# Patient Record
Sex: Female | Born: 1945
Health system: Southern US, Community
[De-identification: ages and names within clinical notes are randomized; demographics above are authoritative.]

## PROBLEM LIST (undated history)

## (undated) DIAGNOSIS — C50919 Malignant neoplasm of unspecified site of unspecified female breast: Secondary | ICD-10-CM

## (undated) DIAGNOSIS — H409 Unspecified glaucoma: Secondary | ICD-10-CM

## (undated) DIAGNOSIS — T7840XA Allergy, unspecified, initial encounter: Secondary | ICD-10-CM

## (undated) DIAGNOSIS — I10 Essential (primary) hypertension: Secondary | ICD-10-CM

## (undated) DIAGNOSIS — J40 Bronchitis, not specified as acute or chronic: Secondary | ICD-10-CM

## (undated) DIAGNOSIS — M1990 Unspecified osteoarthritis, unspecified site: Secondary | ICD-10-CM

## (undated) DIAGNOSIS — C4490 Unspecified malignant neoplasm of skin, unspecified: Secondary | ICD-10-CM

## (undated) HISTORY — PX: MASTECTOMY: SHX3

## (undated) HISTORY — PX: NOSE SURGERY: SHX723

## (undated) HISTORY — PX: JOINT REPLACEMENT: SHX530

## (undated) HISTORY — PX: TUBAL LIGATION: SHX77

## (undated) HISTORY — PX: ABDOMINAL HYSTERECTOMY: SHX81

## (undated) HISTORY — PX: OTHER SURGICAL HISTORY: SHX169

## (undated) HISTORY — PX: GLAUCOMA SURGERY: SHX656

## (undated) HISTORY — DX: Allergy, unspecified, initial encounter: T78.40XA

## (undated) HISTORY — PX: APPENDECTOMY: SHX54

## (undated) HISTORY — DX: Unspecified glaucoma: H40.9

## (undated) HISTORY — PX: COSMETIC SURGERY: SHX468

## (undated) HISTORY — PX: FACIAL COSMETIC SURGERY: SHX629

## (undated) HISTORY — PX: EYE SURGERY: SHX253

## (undated) HISTORY — DX: Unspecified malignant neoplasm of skin, unspecified: C44.90

## (undated) HISTORY — PX: KNEE ARTHROSCOPY: SUR90

## (undated) HISTORY — DX: Malignant neoplasm of unspecified site of unspecified female breast: C50.919

## (undated) HISTORY — PX: BREAST SURGERY: SHX581

---

## 2001-04-06 ENCOUNTER — Other Ambulatory Visit: Admission: RE | Admit: 2001-04-06 | Discharge: 2001-04-06 | Payer: Self-pay | Admitting: Obstetrics & Gynecology

## 2005-03-26 ENCOUNTER — Ambulatory Visit (HOSPITAL_COMMUNITY): Admission: RE | Admit: 2005-03-26 | Discharge: 2005-03-26 | Payer: Self-pay | Admitting: Plastic Surgery

## 2005-03-26 ENCOUNTER — Ambulatory Visit (HOSPITAL_BASED_OUTPATIENT_CLINIC_OR_DEPARTMENT_OTHER): Admission: RE | Admit: 2005-03-26 | Discharge: 2005-03-26 | Payer: Self-pay | Admitting: Plastic Surgery

## 2012-01-26 DIAGNOSIS — Z1231 Encounter for screening mammogram for malignant neoplasm of breast: Secondary | ICD-10-CM | POA: Diagnosis not present

## 2012-01-26 DIAGNOSIS — N760 Acute vaginitis: Secondary | ICD-10-CM | POA: Diagnosis not present

## 2012-01-26 DIAGNOSIS — Z1289 Encounter for screening for malignant neoplasm of other sites: Secondary | ICD-10-CM | POA: Diagnosis not present

## 2012-04-19 DIAGNOSIS — L259 Unspecified contact dermatitis, unspecified cause: Secondary | ICD-10-CM | POA: Diagnosis not present

## 2012-04-19 DIAGNOSIS — E7800 Pure hypercholesterolemia, unspecified: Secondary | ICD-10-CM | POA: Diagnosis not present

## 2012-04-19 DIAGNOSIS — Z1331 Encounter for screening for depression: Secondary | ICD-10-CM | POA: Diagnosis not present

## 2012-04-19 DIAGNOSIS — I10 Essential (primary) hypertension: Secondary | ICD-10-CM | POA: Diagnosis not present

## 2012-08-18 ENCOUNTER — Other Ambulatory Visit: Payer: Self-pay | Admitting: Dermatology

## 2012-08-18 DIAGNOSIS — D485 Neoplasm of uncertain behavior of skin: Secondary | ICD-10-CM | POA: Diagnosis not present

## 2012-08-18 DIAGNOSIS — I781 Nevus, non-neoplastic: Secondary | ICD-10-CM | POA: Diagnosis not present

## 2012-08-18 DIAGNOSIS — Z85820 Personal history of malignant melanoma of skin: Secondary | ICD-10-CM | POA: Diagnosis not present

## 2012-08-18 DIAGNOSIS — L570 Actinic keratosis: Secondary | ICD-10-CM | POA: Diagnosis not present

## 2012-09-01 DIAGNOSIS — M7750 Other enthesopathy of unspecified foot: Secondary | ICD-10-CM | POA: Diagnosis not present

## 2012-09-01 DIAGNOSIS — M779 Enthesopathy, unspecified: Secondary | ICD-10-CM | POA: Diagnosis not present

## 2012-09-08 DIAGNOSIS — H6120 Impacted cerumen, unspecified ear: Secondary | ICD-10-CM | POA: Diagnosis not present

## 2012-09-08 DIAGNOSIS — H906 Mixed conductive and sensorineural hearing loss, bilateral: Secondary | ICD-10-CM | POA: Diagnosis not present

## 2012-09-22 DIAGNOSIS — M7750 Other enthesopathy of unspecified foot: Secondary | ICD-10-CM | POA: Diagnosis not present

## 2012-10-11 DIAGNOSIS — H40019 Open angle with borderline findings, low risk, unspecified eye: Secondary | ICD-10-CM | POA: Diagnosis not present

## 2013-04-12 DIAGNOSIS — I10 Essential (primary) hypertension: Secondary | ICD-10-CM | POA: Diagnosis not present

## 2013-04-12 DIAGNOSIS — Z1211 Encounter for screening for malignant neoplasm of colon: Secondary | ICD-10-CM | POA: Diagnosis not present

## 2013-04-12 DIAGNOSIS — E7800 Pure hypercholesterolemia, unspecified: Secondary | ICD-10-CM | POA: Diagnosis not present

## 2013-05-15 DIAGNOSIS — J209 Acute bronchitis, unspecified: Secondary | ICD-10-CM | POA: Diagnosis not present

## 2013-05-17 DIAGNOSIS — Z01419 Encounter for gynecological examination (general) (routine) without abnormal findings: Secondary | ICD-10-CM | POA: Diagnosis not present

## 2013-05-17 DIAGNOSIS — Z1231 Encounter for screening mammogram for malignant neoplasm of breast: Secondary | ICD-10-CM | POA: Diagnosis not present

## 2013-07-21 DIAGNOSIS — I8310 Varicose veins of unspecified lower extremity with inflammation: Secondary | ICD-10-CM | POA: Diagnosis not present

## 2013-07-22 DIAGNOSIS — I8310 Varicose veins of unspecified lower extremity with inflammation: Secondary | ICD-10-CM | POA: Diagnosis not present

## 2013-07-27 DIAGNOSIS — H903 Sensorineural hearing loss, bilateral: Secondary | ICD-10-CM | POA: Diagnosis not present

## 2013-09-08 DIAGNOSIS — L821 Other seborrheic keratosis: Secondary | ICD-10-CM | POA: Diagnosis not present

## 2013-09-08 DIAGNOSIS — Z85820 Personal history of malignant melanoma of skin: Secondary | ICD-10-CM | POA: Diagnosis not present

## 2013-09-08 DIAGNOSIS — I781 Nevus, non-neoplastic: Secondary | ICD-10-CM | POA: Diagnosis not present

## 2013-09-08 DIAGNOSIS — L570 Actinic keratosis: Secondary | ICD-10-CM | POA: Diagnosis not present

## 2013-09-08 DIAGNOSIS — D239 Other benign neoplasm of skin, unspecified: Secondary | ICD-10-CM | POA: Diagnosis not present

## 2013-10-13 DIAGNOSIS — H40019 Open angle with borderline findings, low risk, unspecified eye: Secondary | ICD-10-CM | POA: Diagnosis not present

## 2013-10-13 DIAGNOSIS — H2589 Other age-related cataract: Secondary | ICD-10-CM | POA: Diagnosis not present

## 2013-10-13 DIAGNOSIS — H04129 Dry eye syndrome of unspecified lacrimal gland: Secondary | ICD-10-CM | POA: Diagnosis not present

## 2013-10-25 DIAGNOSIS — I8310 Varicose veins of unspecified lower extremity with inflammation: Secondary | ICD-10-CM | POA: Diagnosis not present

## 2013-11-02 DIAGNOSIS — I8310 Varicose veins of unspecified lower extremity with inflammation: Secondary | ICD-10-CM | POA: Diagnosis not present

## 2013-11-04 DIAGNOSIS — I8310 Varicose veins of unspecified lower extremity with inflammation: Secondary | ICD-10-CM | POA: Diagnosis not present

## 2013-11-21 DIAGNOSIS — M79609 Pain in unspecified limb: Secondary | ICD-10-CM | POA: Diagnosis not present

## 2013-11-21 DIAGNOSIS — I8310 Varicose veins of unspecified lower extremity with inflammation: Secondary | ICD-10-CM | POA: Diagnosis not present

## 2013-12-07 DIAGNOSIS — I8310 Varicose veins of unspecified lower extremity with inflammation: Secondary | ICD-10-CM | POA: Diagnosis not present

## 2013-12-07 DIAGNOSIS — M79609 Pain in unspecified limb: Secondary | ICD-10-CM | POA: Diagnosis not present

## 2013-12-27 DIAGNOSIS — M79609 Pain in unspecified limb: Secondary | ICD-10-CM | POA: Diagnosis not present

## 2013-12-27 DIAGNOSIS — I8310 Varicose veins of unspecified lower extremity with inflammation: Secondary | ICD-10-CM | POA: Diagnosis not present

## 2013-12-27 DIAGNOSIS — M7981 Nontraumatic hematoma of soft tissue: Secondary | ICD-10-CM | POA: Diagnosis not present

## 2014-01-10 DIAGNOSIS — I8310 Varicose veins of unspecified lower extremity with inflammation: Secondary | ICD-10-CM | POA: Diagnosis not present

## 2014-01-10 DIAGNOSIS — M79609 Pain in unspecified limb: Secondary | ICD-10-CM | POA: Diagnosis not present

## 2014-01-24 DIAGNOSIS — M7981 Nontraumatic hematoma of soft tissue: Secondary | ICD-10-CM | POA: Diagnosis not present

## 2014-01-24 DIAGNOSIS — I8310 Varicose veins of unspecified lower extremity with inflammation: Secondary | ICD-10-CM | POA: Diagnosis not present

## 2014-01-24 DIAGNOSIS — M79609 Pain in unspecified limb: Secondary | ICD-10-CM | POA: Diagnosis not present

## 2014-02-07 DIAGNOSIS — I8310 Varicose veins of unspecified lower extremity with inflammation: Secondary | ICD-10-CM | POA: Diagnosis not present

## 2014-02-07 DIAGNOSIS — M79609 Pain in unspecified limb: Secondary | ICD-10-CM | POA: Diagnosis not present

## 2014-04-13 DIAGNOSIS — I10 Essential (primary) hypertension: Secondary | ICD-10-CM | POA: Diagnosis not present

## 2014-04-13 DIAGNOSIS — E7800 Pure hypercholesterolemia, unspecified: Secondary | ICD-10-CM | POA: Diagnosis not present

## 2014-05-22 DIAGNOSIS — Z1231 Encounter for screening mammogram for malignant neoplasm of breast: Secondary | ICD-10-CM | POA: Diagnosis not present

## 2014-05-22 DIAGNOSIS — N951 Menopausal and female climacteric states: Secondary | ICD-10-CM | POA: Diagnosis not present

## 2014-05-30 DIAGNOSIS — I8310 Varicose veins of unspecified lower extremity with inflammation: Secondary | ICD-10-CM | POA: Diagnosis not present

## 2014-06-05 DIAGNOSIS — I8310 Varicose veins of unspecified lower extremity with inflammation: Secondary | ICD-10-CM | POA: Diagnosis not present

## 2014-08-17 DIAGNOSIS — H903 Sensorineural hearing loss, bilateral: Secondary | ICD-10-CM | POA: Diagnosis not present

## 2014-09-25 DIAGNOSIS — D1800 Hemangioma unspecified site: Secondary | ICD-10-CM | POA: Diagnosis not present

## 2014-09-25 DIAGNOSIS — L570 Actinic keratosis: Secondary | ICD-10-CM | POA: Diagnosis not present

## 2014-09-25 DIAGNOSIS — D229 Melanocytic nevi, unspecified: Secondary | ICD-10-CM | POA: Diagnosis not present

## 2014-09-25 DIAGNOSIS — Z86018 Personal history of other benign neoplasm: Secondary | ICD-10-CM | POA: Diagnosis not present

## 2014-09-25 DIAGNOSIS — Z808 Family history of malignant neoplasm of other organs or systems: Secondary | ICD-10-CM | POA: Diagnosis not present

## 2014-09-25 DIAGNOSIS — Z85820 Personal history of malignant melanoma of skin: Secondary | ICD-10-CM | POA: Diagnosis not present

## 2014-09-25 DIAGNOSIS — D239 Other benign neoplasm of skin, unspecified: Secondary | ICD-10-CM | POA: Diagnosis not present

## 2014-09-25 DIAGNOSIS — L821 Other seborrheic keratosis: Secondary | ICD-10-CM | POA: Diagnosis not present

## 2014-10-13 DIAGNOSIS — H2513 Age-related nuclear cataract, bilateral: Secondary | ICD-10-CM | POA: Diagnosis not present

## 2014-10-13 DIAGNOSIS — H40013 Open angle with borderline findings, low risk, bilateral: Secondary | ICD-10-CM | POA: Diagnosis not present

## 2014-11-03 DIAGNOSIS — J209 Acute bronchitis, unspecified: Secondary | ICD-10-CM | POA: Diagnosis not present

## 2015-01-02 ENCOUNTER — Other Ambulatory Visit: Payer: Self-pay | Admitting: Dermatology

## 2015-01-02 DIAGNOSIS — L821 Other seborrheic keratosis: Secondary | ICD-10-CM | POA: Diagnosis not present

## 2015-01-02 DIAGNOSIS — D485 Neoplasm of uncertain behavior of skin: Secondary | ICD-10-CM | POA: Diagnosis not present

## 2015-04-12 DIAGNOSIS — Z1389 Encounter for screening for other disorder: Secondary | ICD-10-CM | POA: Diagnosis not present

## 2015-04-12 DIAGNOSIS — E785 Hyperlipidemia, unspecified: Secondary | ICD-10-CM | POA: Diagnosis not present

## 2015-04-12 DIAGNOSIS — Z23 Encounter for immunization: Secondary | ICD-10-CM | POA: Diagnosis not present

## 2015-04-12 DIAGNOSIS — I10 Essential (primary) hypertension: Secondary | ICD-10-CM | POA: Diagnosis not present

## 2015-04-24 DIAGNOSIS — I87393 Chronic venous hypertension (idiopathic) with other complications of bilateral lower extremity: Secondary | ICD-10-CM | POA: Diagnosis not present

## 2015-05-20 DIAGNOSIS — J209 Acute bronchitis, unspecified: Secondary | ICD-10-CM | POA: Diagnosis not present

## 2015-07-10 DIAGNOSIS — I87393 Chronic venous hypertension (idiopathic) with other complications of bilateral lower extremity: Secondary | ICD-10-CM | POA: Diagnosis not present

## 2015-07-12 DIAGNOSIS — M79605 Pain in left leg: Secondary | ICD-10-CM | POA: Diagnosis not present

## 2015-07-12 DIAGNOSIS — M79604 Pain in right leg: Secondary | ICD-10-CM | POA: Diagnosis not present

## 2015-07-17 DIAGNOSIS — R609 Edema, unspecified: Secondary | ICD-10-CM | POA: Diagnosis not present

## 2015-07-26 DIAGNOSIS — M79605 Pain in left leg: Secondary | ICD-10-CM | POA: Diagnosis not present

## 2015-07-26 DIAGNOSIS — M79604 Pain in right leg: Secondary | ICD-10-CM | POA: Diagnosis not present

## 2015-07-26 DIAGNOSIS — R600 Localized edema: Secondary | ICD-10-CM | POA: Diagnosis not present

## 2015-08-10 DIAGNOSIS — L820 Inflamed seborrheic keratosis: Secondary | ICD-10-CM | POA: Diagnosis not present

## 2015-08-10 DIAGNOSIS — D485 Neoplasm of uncertain behavior of skin: Secondary | ICD-10-CM | POA: Diagnosis not present

## 2015-08-20 DIAGNOSIS — L03116 Cellulitis of left lower limb: Secondary | ICD-10-CM | POA: Diagnosis not present

## 2015-09-01 DIAGNOSIS — L03116 Cellulitis of left lower limb: Secondary | ICD-10-CM | POA: Diagnosis not present

## 2015-09-01 DIAGNOSIS — M7989 Other specified soft tissue disorders: Secondary | ICD-10-CM | POA: Diagnosis not present

## 2015-09-01 DIAGNOSIS — Z792 Long term (current) use of antibiotics: Secondary | ICD-10-CM | POA: Diagnosis not present

## 2015-09-01 DIAGNOSIS — Z79899 Other long term (current) drug therapy: Secondary | ICD-10-CM | POA: Diagnosis not present

## 2015-09-01 DIAGNOSIS — Z885 Allergy status to narcotic agent status: Secondary | ICD-10-CM | POA: Diagnosis not present

## 2015-09-01 DIAGNOSIS — I10 Essential (primary) hypertension: Secondary | ICD-10-CM | POA: Diagnosis not present

## 2015-09-13 DIAGNOSIS — S81802A Unspecified open wound, left lower leg, initial encounter: Secondary | ICD-10-CM | POA: Diagnosis not present

## 2015-09-13 DIAGNOSIS — R609 Edema, unspecified: Secondary | ICD-10-CM | POA: Diagnosis not present

## 2015-09-27 DIAGNOSIS — Z86018 Personal history of other benign neoplasm: Secondary | ICD-10-CM | POA: Diagnosis not present

## 2015-09-27 DIAGNOSIS — Z87898 Personal history of other specified conditions: Secondary | ICD-10-CM | POA: Diagnosis not present

## 2015-09-27 DIAGNOSIS — L03019 Cellulitis of unspecified finger: Secondary | ICD-10-CM | POA: Diagnosis not present

## 2015-09-27 DIAGNOSIS — I8311 Varicose veins of right lower extremity with inflammation: Secondary | ICD-10-CM | POA: Diagnosis not present

## 2015-09-27 DIAGNOSIS — I8312 Varicose veins of left lower extremity with inflammation: Secondary | ICD-10-CM | POA: Diagnosis not present

## 2015-09-27 DIAGNOSIS — L570 Actinic keratosis: Secondary | ICD-10-CM | POA: Diagnosis not present

## 2015-09-27 DIAGNOSIS — L601 Onycholysis: Secondary | ICD-10-CM | POA: Diagnosis not present

## 2015-09-27 DIAGNOSIS — D2272 Melanocytic nevi of left lower limb, including hip: Secondary | ICD-10-CM | POA: Diagnosis not present

## 2015-10-15 DIAGNOSIS — H40013 Open angle with borderline findings, low risk, bilateral: Secondary | ICD-10-CM | POA: Diagnosis not present

## 2015-10-15 DIAGNOSIS — H40033 Anatomical narrow angle, bilateral: Secondary | ICD-10-CM | POA: Diagnosis not present

## 2015-10-24 DIAGNOSIS — L570 Actinic keratosis: Secondary | ICD-10-CM | POA: Diagnosis not present

## 2015-10-24 DIAGNOSIS — I83893 Varicose veins of bilateral lower extremities with other complications: Secondary | ICD-10-CM | POA: Diagnosis not present

## 2015-10-24 DIAGNOSIS — L601 Onycholysis: Secondary | ICD-10-CM | POA: Diagnosis not present

## 2015-10-24 DIAGNOSIS — I83813 Varicose veins of bilateral lower extremities with pain: Secondary | ICD-10-CM | POA: Diagnosis not present

## 2015-12-03 DIAGNOSIS — Z1231 Encounter for screening mammogram for malignant neoplasm of breast: Secondary | ICD-10-CM | POA: Diagnosis not present

## 2015-12-03 DIAGNOSIS — Z6827 Body mass index (BMI) 27.0-27.9, adult: Secondary | ICD-10-CM | POA: Diagnosis not present

## 2015-12-03 DIAGNOSIS — N959 Unspecified menopausal and perimenopausal disorder: Secondary | ICD-10-CM | POA: Diagnosis not present

## 2015-12-10 DIAGNOSIS — R05 Cough: Secondary | ICD-10-CM | POA: Diagnosis not present

## 2015-12-10 DIAGNOSIS — J069 Acute upper respiratory infection, unspecified: Secondary | ICD-10-CM | POA: Diagnosis not present

## 2016-01-18 DIAGNOSIS — H40013 Open angle with borderline findings, low risk, bilateral: Secondary | ICD-10-CM | POA: Diagnosis not present

## 2016-01-23 DIAGNOSIS — Z1211 Encounter for screening for malignant neoplasm of colon: Secondary | ICD-10-CM | POA: Diagnosis not present

## 2016-02-06 DIAGNOSIS — H40013 Open angle with borderline findings, low risk, bilateral: Secondary | ICD-10-CM | POA: Diagnosis not present

## 2016-03-05 DIAGNOSIS — M25562 Pain in left knee: Secondary | ICD-10-CM | POA: Diagnosis not present

## 2016-03-05 DIAGNOSIS — M25561 Pain in right knee: Secondary | ICD-10-CM | POA: Diagnosis not present

## 2016-03-18 DIAGNOSIS — H903 Sensorineural hearing loss, bilateral: Secondary | ICD-10-CM | POA: Diagnosis not present

## 2016-03-20 DIAGNOSIS — H02403 Unspecified ptosis of bilateral eyelids: Secondary | ICD-10-CM | POA: Diagnosis not present

## 2016-03-20 DIAGNOSIS — I83813 Varicose veins of bilateral lower extremities with pain: Secondary | ICD-10-CM | POA: Diagnosis not present

## 2016-03-20 DIAGNOSIS — I83893 Varicose veins of bilateral lower extremities with other complications: Secondary | ICD-10-CM | POA: Diagnosis not present

## 2016-04-15 DIAGNOSIS — E785 Hyperlipidemia, unspecified: Secondary | ICD-10-CM | POA: Diagnosis not present

## 2016-04-15 DIAGNOSIS — I10 Essential (primary) hypertension: Secondary | ICD-10-CM | POA: Diagnosis not present

## 2016-04-16 DIAGNOSIS — M25562 Pain in left knee: Secondary | ICD-10-CM | POA: Diagnosis not present

## 2016-04-16 DIAGNOSIS — M25561 Pain in right knee: Secondary | ICD-10-CM | POA: Diagnosis not present

## 2016-04-17 DIAGNOSIS — L820 Inflamed seborrheic keratosis: Secondary | ICD-10-CM | POA: Diagnosis not present

## 2016-04-17 DIAGNOSIS — C44729 Squamous cell carcinoma of skin of left lower limb, including hip: Secondary | ICD-10-CM | POA: Diagnosis not present

## 2016-04-17 DIAGNOSIS — H40012 Open angle with borderline findings, low risk, left eye: Secondary | ICD-10-CM | POA: Diagnosis not present

## 2016-04-17 DIAGNOSIS — H401111 Primary open-angle glaucoma, right eye, mild stage: Secondary | ICD-10-CM | POA: Diagnosis not present

## 2016-04-17 DIAGNOSIS — L570 Actinic keratosis: Secondary | ICD-10-CM | POA: Diagnosis not present

## 2016-04-17 DIAGNOSIS — D485 Neoplasm of uncertain behavior of skin: Secondary | ICD-10-CM | POA: Diagnosis not present

## 2016-04-29 DIAGNOSIS — Z5189 Encounter for other specified aftercare: Secondary | ICD-10-CM | POA: Diagnosis not present

## 2016-05-22 DIAGNOSIS — C44729 Squamous cell carcinoma of skin of left lower limb, including hip: Secondary | ICD-10-CM | POA: Diagnosis not present

## 2016-05-22 DIAGNOSIS — L089 Local infection of the skin and subcutaneous tissue, unspecified: Secondary | ICD-10-CM | POA: Diagnosis not present

## 2016-07-16 DIAGNOSIS — M25561 Pain in right knee: Secondary | ICD-10-CM | POA: Diagnosis not present

## 2016-07-16 DIAGNOSIS — M25562 Pain in left knee: Secondary | ICD-10-CM | POA: Diagnosis not present

## 2016-07-17 DIAGNOSIS — H401111 Primary open-angle glaucoma, right eye, mild stage: Secondary | ICD-10-CM | POA: Diagnosis not present

## 2016-07-17 DIAGNOSIS — H40012 Open angle with borderline findings, low risk, left eye: Secondary | ICD-10-CM | POA: Diagnosis not present

## 2016-07-17 DIAGNOSIS — H0015 Chalazion left lower eyelid: Secondary | ICD-10-CM | POA: Diagnosis not present

## 2016-07-22 DIAGNOSIS — R197 Diarrhea, unspecified: Secondary | ICD-10-CM | POA: Diagnosis not present

## 2016-07-23 DIAGNOSIS — R197 Diarrhea, unspecified: Secondary | ICD-10-CM | POA: Diagnosis not present

## 2016-07-29 DIAGNOSIS — M25561 Pain in right knee: Secondary | ICD-10-CM | POA: Diagnosis not present

## 2016-07-29 DIAGNOSIS — M170 Bilateral primary osteoarthritis of knee: Secondary | ICD-10-CM | POA: Diagnosis not present

## 2016-07-29 DIAGNOSIS — M25562 Pain in left knee: Secondary | ICD-10-CM | POA: Diagnosis not present

## 2016-08-06 DIAGNOSIS — M1711 Unilateral primary osteoarthritis, right knee: Secondary | ICD-10-CM | POA: Diagnosis not present

## 2016-08-06 DIAGNOSIS — M1712 Unilateral primary osteoarthritis, left knee: Secondary | ICD-10-CM | POA: Diagnosis not present

## 2016-08-07 DIAGNOSIS — K580 Irritable bowel syndrome with diarrhea: Secondary | ICD-10-CM | POA: Diagnosis not present

## 2016-08-07 DIAGNOSIS — R197 Diarrhea, unspecified: Secondary | ICD-10-CM | POA: Diagnosis not present

## 2016-08-13 DIAGNOSIS — M1712 Unilateral primary osteoarthritis, left knee: Secondary | ICD-10-CM | POA: Diagnosis not present

## 2016-08-13 DIAGNOSIS — M1711 Unilateral primary osteoarthritis, right knee: Secondary | ICD-10-CM | POA: Diagnosis not present

## 2016-08-27 DIAGNOSIS — Z09 Encounter for follow-up examination after completed treatment for conditions other than malignant neoplasm: Secondary | ICD-10-CM | POA: Diagnosis not present

## 2016-09-02 DIAGNOSIS — M1711 Unilateral primary osteoarthritis, right knee: Secondary | ICD-10-CM | POA: Diagnosis not present

## 2016-09-23 DIAGNOSIS — R05 Cough: Secondary | ICD-10-CM | POA: Diagnosis not present

## 2016-09-23 DIAGNOSIS — J069 Acute upper respiratory infection, unspecified: Secondary | ICD-10-CM | POA: Diagnosis not present

## 2016-09-30 DIAGNOSIS — M1711 Unilateral primary osteoarthritis, right knee: Secondary | ICD-10-CM | POA: Diagnosis not present

## 2016-10-08 DIAGNOSIS — M1711 Unilateral primary osteoarthritis, right knee: Secondary | ICD-10-CM | POA: Diagnosis not present

## 2016-10-08 DIAGNOSIS — M23241 Derangement of anterior horn of lateral meniscus due to old tear or injury, right knee: Secondary | ICD-10-CM | POA: Diagnosis not present

## 2016-10-09 DIAGNOSIS — D485 Neoplasm of uncertain behavior of skin: Secondary | ICD-10-CM | POA: Diagnosis not present

## 2016-10-09 DIAGNOSIS — Z808 Family history of malignant neoplasm of other organs or systems: Secondary | ICD-10-CM | POA: Diagnosis not present

## 2016-10-09 DIAGNOSIS — L820 Inflamed seborrheic keratosis: Secondary | ICD-10-CM | POA: Diagnosis not present

## 2016-10-09 DIAGNOSIS — D2272 Melanocytic nevi of left lower limb, including hip: Secondary | ICD-10-CM | POA: Diagnosis not present

## 2016-10-09 DIAGNOSIS — Z85828 Personal history of other malignant neoplasm of skin: Secondary | ICD-10-CM | POA: Diagnosis not present

## 2016-10-09 DIAGNOSIS — L821 Other seborrheic keratosis: Secondary | ICD-10-CM | POA: Diagnosis not present

## 2016-10-09 DIAGNOSIS — Z23 Encounter for immunization: Secondary | ICD-10-CM | POA: Diagnosis not present

## 2016-10-09 DIAGNOSIS — Z85820 Personal history of malignant melanoma of skin: Secondary | ICD-10-CM | POA: Diagnosis not present

## 2016-10-09 DIAGNOSIS — L570 Actinic keratosis: Secondary | ICD-10-CM | POA: Diagnosis not present

## 2016-10-14 DIAGNOSIS — M1711 Unilateral primary osteoarthritis, right knee: Secondary | ICD-10-CM | POA: Diagnosis not present

## 2016-11-20 DIAGNOSIS — G8918 Other acute postprocedural pain: Secondary | ICD-10-CM | POA: Diagnosis not present

## 2016-11-20 DIAGNOSIS — Z79899 Other long term (current) drug therapy: Secondary | ICD-10-CM | POA: Diagnosis not present

## 2016-11-20 DIAGNOSIS — S83281A Other tear of lateral meniscus, current injury, right knee, initial encounter: Secondary | ICD-10-CM | POA: Diagnosis not present

## 2016-11-20 DIAGNOSIS — M23341 Other meniscus derangements, anterior horn of lateral meniscus, right knee: Secondary | ICD-10-CM | POA: Diagnosis not present

## 2016-11-20 DIAGNOSIS — Z793 Long term (current) use of hormonal contraceptives: Secondary | ICD-10-CM | POA: Diagnosis not present

## 2016-11-20 DIAGNOSIS — K219 Gastro-esophageal reflux disease without esophagitis: Secondary | ICD-10-CM | POA: Diagnosis not present

## 2016-11-20 DIAGNOSIS — M23303 Other meniscus derangements, unspecified medial meniscus, right knee: Secondary | ICD-10-CM | POA: Diagnosis not present

## 2016-11-20 DIAGNOSIS — I10 Essential (primary) hypertension: Secondary | ICD-10-CM | POA: Diagnosis not present

## 2016-11-20 DIAGNOSIS — M2241 Chondromalacia patellae, right knee: Secondary | ICD-10-CM | POA: Diagnosis not present

## 2016-11-20 DIAGNOSIS — S83241A Other tear of medial meniscus, current injury, right knee, initial encounter: Secondary | ICD-10-CM | POA: Diagnosis not present

## 2016-11-20 DIAGNOSIS — Z885 Allergy status to narcotic agent status: Secondary | ICD-10-CM | POA: Diagnosis not present

## 2016-12-09 DIAGNOSIS — M1712 Unilateral primary osteoarthritis, left knee: Secondary | ICD-10-CM | POA: Diagnosis not present

## 2016-12-22 DIAGNOSIS — Z853 Personal history of malignant neoplasm of breast: Secondary | ICD-10-CM | POA: Insufficient documentation

## 2016-12-22 HISTORY — DX: Personal history of malignant neoplasm of breast: Z85.3

## 2016-12-29 DIAGNOSIS — M1712 Unilateral primary osteoarthritis, left knee: Secondary | ICD-10-CM | POA: Diagnosis not present

## 2016-12-29 DIAGNOSIS — M25562 Pain in left knee: Secondary | ICD-10-CM | POA: Diagnosis not present

## 2016-12-29 DIAGNOSIS — M25561 Pain in right knee: Secondary | ICD-10-CM | POA: Diagnosis not present

## 2016-12-30 DIAGNOSIS — M25562 Pain in left knee: Secondary | ICD-10-CM | POA: Diagnosis not present

## 2016-12-30 DIAGNOSIS — M1712 Unilateral primary osteoarthritis, left knee: Secondary | ICD-10-CM | POA: Diagnosis not present

## 2016-12-30 DIAGNOSIS — M25561 Pain in right knee: Secondary | ICD-10-CM | POA: Diagnosis not present

## 2016-12-30 DIAGNOSIS — M25462 Effusion, left knee: Secondary | ICD-10-CM | POA: Diagnosis not present

## 2017-01-12 DIAGNOSIS — S83282A Other tear of lateral meniscus, current injury, left knee, initial encounter: Secondary | ICD-10-CM | POA: Diagnosis not present

## 2017-01-20 DIAGNOSIS — H401131 Primary open-angle glaucoma, bilateral, mild stage: Secondary | ICD-10-CM | POA: Diagnosis not present

## 2017-01-29 DIAGNOSIS — Z79899 Other long term (current) drug therapy: Secondary | ICD-10-CM | POA: Diagnosis not present

## 2017-01-29 DIAGNOSIS — S83282A Other tear of lateral meniscus, current injury, left knee, initial encounter: Secondary | ICD-10-CM | POA: Diagnosis not present

## 2017-01-29 DIAGNOSIS — G8918 Other acute postprocedural pain: Secondary | ICD-10-CM | POA: Diagnosis not present

## 2017-01-29 DIAGNOSIS — I10 Essential (primary) hypertension: Secondary | ICD-10-CM | POA: Diagnosis not present

## 2017-01-29 DIAGNOSIS — S83232A Complex tear of medial meniscus, current injury, left knee, initial encounter: Secondary | ICD-10-CM | POA: Diagnosis not present

## 2017-01-29 DIAGNOSIS — K219 Gastro-esophageal reflux disease without esophagitis: Secondary | ICD-10-CM | POA: Diagnosis not present

## 2017-01-29 DIAGNOSIS — Z885 Allergy status to narcotic agent status: Secondary | ICD-10-CM | POA: Diagnosis not present

## 2017-01-29 DIAGNOSIS — S83242A Other tear of medial meniscus, current injury, left knee, initial encounter: Secondary | ICD-10-CM | POA: Diagnosis not present

## 2017-02-05 DIAGNOSIS — Z4789 Encounter for other orthopedic aftercare: Secondary | ICD-10-CM | POA: Diagnosis not present

## 2017-02-10 DIAGNOSIS — H40013 Open angle with borderline findings, low risk, bilateral: Secondary | ICD-10-CM | POA: Diagnosis not present

## 2017-02-26 DIAGNOSIS — I83893 Varicose veins of bilateral lower extremities with other complications: Secondary | ICD-10-CM | POA: Diagnosis not present

## 2017-02-27 DIAGNOSIS — M25562 Pain in left knee: Secondary | ICD-10-CM | POA: Diagnosis not present

## 2017-02-27 DIAGNOSIS — Z4789 Encounter for other orthopedic aftercare: Secondary | ICD-10-CM | POA: Diagnosis not present

## 2017-04-06 DIAGNOSIS — Z6829 Body mass index (BMI) 29.0-29.9, adult: Secondary | ICD-10-CM | POA: Diagnosis not present

## 2017-04-06 DIAGNOSIS — Z1289 Encounter for screening for malignant neoplasm of other sites: Secondary | ICD-10-CM | POA: Diagnosis not present

## 2017-04-06 DIAGNOSIS — Z1231 Encounter for screening mammogram for malignant neoplasm of breast: Secondary | ICD-10-CM | POA: Diagnosis not present

## 2017-04-09 ENCOUNTER — Other Ambulatory Visit: Payer: Self-pay | Admitting: Obstetrics & Gynecology

## 2017-04-09 DIAGNOSIS — R928 Other abnormal and inconclusive findings on diagnostic imaging of breast: Secondary | ICD-10-CM

## 2017-04-10 ENCOUNTER — Other Ambulatory Visit: Payer: Self-pay | Admitting: Internal Medicine

## 2017-04-14 ENCOUNTER — Ambulatory Visit
Admission: RE | Admit: 2017-04-14 | Discharge: 2017-04-14 | Disposition: A | Discharge status: Home / Self Care | Payer: Medicare Other | Source: Ambulatory Visit | Attending: Obstetrics & Gynecology | Admitting: Obstetrics & Gynecology

## 2017-04-14 ENCOUNTER — Other Ambulatory Visit: Payer: Self-pay | Admitting: Obstetrics & Gynecology

## 2017-04-14 DIAGNOSIS — R928 Other abnormal and inconclusive findings on diagnostic imaging of breast: Secondary | ICD-10-CM

## 2017-04-14 DIAGNOSIS — R921 Mammographic calcification found on diagnostic imaging of breast: Secondary | ICD-10-CM

## 2017-04-15 DIAGNOSIS — M7989 Other specified soft tissue disorders: Secondary | ICD-10-CM | POA: Diagnosis not present

## 2017-04-15 DIAGNOSIS — R2242 Localized swelling, mass and lump, left lower limb: Secondary | ICD-10-CM | POA: Diagnosis not present

## 2017-04-15 DIAGNOSIS — M79605 Pain in left leg: Secondary | ICD-10-CM | POA: Diagnosis not present

## 2017-04-15 DIAGNOSIS — M1712 Unilateral primary osteoarthritis, left knee: Secondary | ICD-10-CM | POA: Diagnosis not present

## 2017-04-16 ENCOUNTER — Ambulatory Visit
Admission: RE | Admit: 2017-04-16 | Discharge: 2017-04-16 | Disposition: A | Discharge status: Home / Self Care | Payer: Medicare Other | Source: Ambulatory Visit | Attending: Obstetrics & Gynecology | Admitting: Obstetrics & Gynecology

## 2017-04-16 ENCOUNTER — Other Ambulatory Visit: Payer: Self-pay | Admitting: Obstetrics & Gynecology

## 2017-04-16 DIAGNOSIS — R921 Mammographic calcification found on diagnostic imaging of breast: Secondary | ICD-10-CM

## 2017-04-16 DIAGNOSIS — D0511 Intraductal carcinoma in situ of right breast: Secondary | ICD-10-CM | POA: Diagnosis not present

## 2017-04-16 DIAGNOSIS — H401131 Primary open-angle glaucoma, bilateral, mild stage: Secondary | ICD-10-CM | POA: Diagnosis not present

## 2017-04-20 ENCOUNTER — Ambulatory Visit: Payer: Self-pay | Admitting: General Surgery

## 2017-04-20 DIAGNOSIS — D0511 Intraductal carcinoma in situ of right breast: Secondary | ICD-10-CM

## 2017-04-24 NOTE — Pre-Procedure Instructions (Signed)
Amy [name]  04/24/2017      [id number] - [place], [place] - [address] [place] [address] Phone: [phone] Fax: [phone]    Your procedure is scheduled on Fri. May 18  Report to [facility] Admitting at 8:55 A.M.  Call this number if you have problems the morning of surgery:  [phone]   Remember:  Do not eat food or drink liquids after midnight on Thurs. May 17,  Except  drink  Boost Breeze  At 6:55 am   Take these medicines the morning of surgery with A SIP OF WATER : none              May use eye drops   Do not wear jewelry, make-up or nail polish.  Do not wear lotions, powders, or perfumes, or deoderant.  Do not shave 48 hours prior to surgery.  Men may shave face and neck.  Do not bring valuables to the hospital.  [facility] is not responsible for any belongings or valuables.  Contacts, dentures or bridgework may not be worn into surgery.  Leave your suitcase in the car.  After surgery it may be brought to your room.  For patients admitted to the hospital, discharge time will be determined by your treatment team.  Patients discharged the day of surgery will not be allowed to drive home.   Name and phone number of your driver:    Special instructions:  [place]- Preparing For Surgery  Before surgery, you can play an important role. Because skin is not sterile, your skin needs to be as free of germs as possible. You can reduce the number of germs on your skin by washing with CHG (chlorahexidine gluconate) Soap before surgery.  CHG is an antiseptic cleaner which kills germs and bonds with the skin to continue killing germs even after washing.  Please do not use if you have an allergy to CHG or antibacterial soaps. If your skin becomes reddened/irritated stop using the CHG.  Do not shave (including legs and underarms) for at least 48 hours prior to first CHG shower. It is OK to shave your  face.  Please follow these instructions carefully.   1. Shower the NIGHT BEFORE SURGERY and the MORNING OF SURGERY with CHG.   2. If you chose to wash your hair, wash your hair first as usual with your normal shampoo.  3. After you shampoo, rinse your hair and body thoroughly to remove the shampoo.  4. Use CHG as you would any other liquid soap. You can apply CHG directly to the skin and wash gently with a scrungie or a clean washcloth.   5. Apply the CHG Soap to your body ONLY FROM THE NECK DOWN.  Do not use on open wounds or open sores. Avoid contact with your eyes, ears, mouth and genitals (private parts). Wash genitals (private parts) with your normal soap.  6. Wash thoroughly, paying special attention to the area where your surgery will be performed.  7. Thoroughly rinse your body with warm water from the neck down.  8. DO NOT shower/wash with your normal soap after using and rinsing off the CHG Soap.  9. Pat yourself dry with a CLEAN TOWEL.   10. Wear CLEAN PAJAMAS   11. Place CLEAN SHEETS on your bed the night of your first shower and DO NOT SLEEP WITH PETS.    Day of Surgery:  Do not apply any deodorants/lotions. Please wear clean clothes to the hospital/surgery center.      Please read over the following fact sheets that you were given. Coughing and Deep Breathing and Surgical Site Infection Prevention

## 2017-04-27 ENCOUNTER — Encounter (HOSPITAL_COMMUNITY)
Admission: RE | Admit: 2017-04-27 | Discharge: 2017-04-27 | Disposition: A | Discharge status: Home / Self Care | Payer: Medicare Other | Source: Ambulatory Visit | Attending: General Surgery | Admitting: General Surgery

## 2017-04-27 ENCOUNTER — Encounter (HOSPITAL_COMMUNITY): Payer: Self-pay

## 2017-04-27 DIAGNOSIS — Z01810 Encounter for preprocedural cardiovascular examination: Secondary | ICD-10-CM | POA: Insufficient documentation

## 2017-04-27 DIAGNOSIS — D0511 Intraductal carcinoma in situ of right breast: Secondary | ICD-10-CM | POA: Diagnosis not present

## 2017-04-27 DIAGNOSIS — R9431 Abnormal electrocardiogram [ECG] [EKG]: Secondary | ICD-10-CM | POA: Diagnosis not present

## 2017-04-27 HISTORY — DX: Essential (primary) hypertension: I10

## 2017-04-27 HISTORY — DX: Unspecified osteoarthritis, unspecified site: M19.90

## 2017-04-27 LAB — CBC
HEMATOCRIT: 35.7 % — AB (ref 36.0–46.0)
HEMOGLOBIN: 12.1 g/dL (ref 12.0–15.0)
MCH: 29.3 pg (ref 26.0–34.0)
MCHC: 33.9 g/dL (ref 30.0–36.0)
MCV: 86.4 fL (ref 78.0–100.0)
Platelets: 298 10*3/uL (ref 150–400)
RBC: 4.13 MIL/uL (ref 3.87–5.11)
RDW: 13.1 % (ref 11.5–15.5)
WBC: 5.9 10*3/uL (ref 4.0–10.5)

## 2017-04-27 LAB — BASIC METABOLIC PANEL
Anion gap: 8 (ref 5–15)
BUN: 22 mg/dL — AB (ref 6–20)
CHLORIDE: 101 mmol/L (ref 101–111)
CO2: 28 mmol/L (ref 22–32)
Calcium: 9.4 mg/dL (ref 8.9–10.3)
Creatinine, Ser: 1.01 mg/dL — ABNORMAL HIGH (ref 0.44–1.00)
GFR calc Af Amer: >60 mL/min (ref >60)
GFR calc non Af Amer: 55 mL/min — ABNORMAL LOW (ref >60)
Glucose, Bld: 101 mg/dL — ABNORMAL HIGH (ref 65–99)
POTASSIUM: 4.4 mmol/L (ref 3.5–5.1)
SODIUM: 137 mmol/L (ref 135–145)

## 2017-04-27 NOTE — Progress Notes (Signed)
PCP - Corine Shelter, pt to see MD on Wednesday. Pt with elevated BP states her normal is in 170s. Re-check today 154/79. Pt encouraged to talk to PCP about elevated BP. Pt asymptomatic.  Cardiologist - none, pt denies cardiac history or cardiac workup  EKG - 04/27/2017   Patient denies shortness of breath, fever, cough and chest pain at PAT appointment  Patient verbalized understanding of instructions that was given to them at the PAT appointment. Patient expressed that there were no further questions.  Patient was also instructed that they will need to review over the PAT instructions again at home before the surgery.

## 2017-04-28 NOTE — Progress Notes (Signed)
Anesthesia Chart Review:  Pt is a 71 year old female scheduled for R mastectomy with sentinel lymph node biopsy on 05/08/2017 with Jovita Kussmaul, MD.   - PCP is Corine Shelter, PA  PMH includes:  HTN, breast cancer. Never smoker. BMI 30  Medications include: losartan-hctz, timolol.   Preoperative labs reviewed.    EKG 04/27/17: NSR. Septal infarct, age undetermined.   If no changes, I anticipate pt can proceed with surgery as scheduled.   Willeen Cass, FNP-BC [facility]/Anesthesiology Phone: [phone] 04/28/2017 4:09 PM

## 2017-04-30 DIAGNOSIS — Z0000 Encounter for general adult medical examination without abnormal findings: Secondary | ICD-10-CM | POA: Diagnosis not present

## 2017-04-30 DIAGNOSIS — E785 Hyperlipidemia, unspecified: Secondary | ICD-10-CM | POA: Diagnosis not present

## 2017-04-30 DIAGNOSIS — I10 Essential (primary) hypertension: Secondary | ICD-10-CM | POA: Diagnosis not present

## 2017-05-02 ENCOUNTER — Telehealth: Payer: Self-pay | Admitting: Hematology

## 2017-05-02 ENCOUNTER — Encounter: Payer: Self-pay | Admitting: Hematology

## 2017-05-02 NOTE — Telephone Encounter (Signed)
Appt has been scheduled for the pt to see Dr. Burr Medico on 6/1 at 11am. Pt agreed to the appt date and time. Demographics verified. Letter mailed to the pt. Location given.

## 2017-05-08 ENCOUNTER — Ambulatory Visit (HOSPITAL_COMMUNITY): Payer: Medicare Other | Admitting: Emergency Medicine

## 2017-05-08 ENCOUNTER — Ambulatory Visit (HOSPITAL_COMMUNITY)
Admission: RE | Admit: 2017-05-08 | Discharge: 2017-05-09 | Disposition: A | Discharge status: Home / Self Care | Payer: Medicare Other | Source: Ambulatory Visit | Attending: General Surgery | Admitting: General Surgery

## 2017-05-08 ENCOUNTER — Encounter (HOSPITAL_COMMUNITY): Payer: Self-pay | Admitting: *Deleted

## 2017-05-08 ENCOUNTER — Encounter (HOSPITAL_COMMUNITY)
Admission: RE | Disposition: A | Discharge status: Home / Self Care | Payer: Self-pay | Source: Ambulatory Visit | Attending: General Surgery

## 2017-05-08 ENCOUNTER — Encounter (HOSPITAL_COMMUNITY)
Admission: RE | Admit: 2017-05-08 | Discharge: 2017-05-08 | Disposition: A | Discharge status: Home / Self Care | Payer: Medicare Other | Source: Ambulatory Visit | Attending: General Surgery | Admitting: General Surgery

## 2017-05-08 ENCOUNTER — Ambulatory Visit (HOSPITAL_COMMUNITY): Payer: Medicare Other | Admitting: Anesthesiology

## 2017-05-08 DIAGNOSIS — Z79899 Other long term (current) drug therapy: Secondary | ICD-10-CM | POA: Insufficient documentation

## 2017-05-08 DIAGNOSIS — Z90711 Acquired absence of uterus with remaining cervical stump: Secondary | ICD-10-CM | POA: Insufficient documentation

## 2017-05-08 DIAGNOSIS — I10 Essential (primary) hypertension: Secondary | ICD-10-CM | POA: Insufficient documentation

## 2017-05-08 DIAGNOSIS — Z803 Family history of malignant neoplasm of breast: Secondary | ICD-10-CM | POA: Diagnosis not present

## 2017-05-08 DIAGNOSIS — G8918 Other acute postprocedural pain: Secondary | ICD-10-CM | POA: Diagnosis not present

## 2017-05-08 DIAGNOSIS — D0511 Intraductal carcinoma in situ of right breast: Secondary | ICD-10-CM | POA: Diagnosis not present

## 2017-05-08 HISTORY — PX: MASTECTOMY W/ SENTINEL NODE BIOPSY: SHX2001

## 2017-05-08 HISTORY — PX: MASTECTOMY COMPLETE / SIMPLE W/ SENTINEL NODE BIOPSY: SUR846

## 2017-05-08 SURGERY — MASTECTOMY WITH SENTINEL LYMPH NODE BIOPSY
Anesthesia: Regional | Site: Breast | Laterality: Right

## 2017-05-08 MED ORDER — TECHNETIUM TC 99M SULFUR COLLOID FILTERED
1.0000 | Freq: Once | INTRAVENOUS | Status: AC | PRN
  Administered 2017-05-08: 1 via INTRADERMAL

## 2017-05-08 MED ORDER — ONDANSETRON HCL 4 MG/2ML IJ SOLN
INTRAMUSCULAR | Status: DC | PRN
  Administered 2017-05-08: 4 mg via INTRAVENOUS

## 2017-05-08 MED ORDER — GABAPENTIN 300 MG PO CAPS
300.0000 mg | ORAL_CAPSULE | ORAL | Status: AC
  Administered 2017-05-08: 300 mg via ORAL
  Filled 2017-05-08: qty 1

## 2017-05-08 MED ORDER — PROPOFOL 10 MG/ML IV BOLUS
INTRAVENOUS | Status: AC
  Filled 2017-05-08: qty 40

## 2017-05-08 MED ORDER — FENTANYL CITRATE (PF) 100 MCG/2ML IJ SOLN
25.0000 ug | INTRAMUSCULAR | Status: DC | PRN
  Administered 2017-05-08 (×3): 50 ug via INTRAVENOUS

## 2017-05-08 MED ORDER — FENTANYL CITRATE (PF) 100 MCG/2ML IJ SOLN
100.0000 ug | Freq: Once | INTRAMUSCULAR | Status: AC
  Administered 2017-05-08: 50 ug via INTRAVENOUS
  Filled 2017-05-08: qty 2

## 2017-05-08 MED ORDER — ACETAMINOPHEN 500 MG PO TABS
1000.0000 mg | ORAL_TABLET | ORAL | Status: AC
  Administered 2017-05-08: 1000 mg via ORAL
  Filled 2017-05-08: qty 2

## 2017-05-08 MED ORDER — OXYCODONE HCL 5 MG PO TABS: 5.0000 mg | ORAL_TABLET | Freq: Once | ORAL | Status: DC | PRN

## 2017-05-08 MED ORDER — 0.9 % SODIUM CHLORIDE (POUR BTL) OPTIME
TOPICAL | Status: DC | PRN
  Administered 2017-05-08 (×2): 1000 mL

## 2017-05-08 MED ORDER — HYDROCHLOROTHIAZIDE 25 MG PO TABS
25.0000 mg | ORAL_TABLET | Freq: Every day | ORAL | Status: DC
  Administered 2017-05-08: 25 mg via ORAL
  Filled 2017-05-08: qty 1

## 2017-05-08 MED ORDER — KCL IN DEXTROSE-NACL 20-5-0.9 MEQ/L-%-% IV SOLN
INTRAVENOUS | Status: DC
  Administered 2017-05-08: 15:00:00 via INTRAVENOUS
  Filled 2017-05-08 (×2): qty 1000

## 2017-05-08 MED ORDER — HEPARIN SODIUM (PORCINE) 5000 UNIT/ML IJ SOLN
5000.0000 [IU] | Freq: Three times a day (TID) | INTRAMUSCULAR | Status: DC
  Administered 2017-05-09: 5000 [IU] via SUBCUTANEOUS

## 2017-05-08 MED ORDER — LACTATED RINGERS IV SOLN
INTRAVENOUS | Status: DC
Start: 2017-05-08 — End: 2017-05-08
  Administered 2017-05-08 (×2): via INTRAVENOUS

## 2017-05-08 MED ORDER — HYDROCODONE-ACETAMINOPHEN 5-325 MG PO TABS
1.0000 | ORAL_TABLET | ORAL | Status: DC | PRN
  Administered 2017-05-08: 1 via ORAL
  Filled 2017-05-08: qty 1

## 2017-05-08 MED ORDER — FENTANYL CITRATE (PF) 100 MCG/2ML IJ SOLN
INTRAMUSCULAR | Status: AC
  Administered 2017-05-08: 50 ug via INTRAVENOUS
  Filled 2017-05-08: qty 2

## 2017-05-08 MED ORDER — CELECOXIB 200 MG PO CAPS
400.0000 mg | ORAL_CAPSULE | ORAL | Status: AC
  Administered 2017-05-08: 400 mg via ORAL
  Filled 2017-05-08: qty 2

## 2017-05-08 MED ORDER — PROPOFOL 10 MG/ML IV BOLUS
INTRAVENOUS | Status: DC | PRN
  Administered 2017-05-08: 200 mg via INTRAVENOUS

## 2017-05-08 MED ORDER — FENTANYL CITRATE (PF) 100 MCG/2ML IJ SOLN
INTRAMUSCULAR | Status: DC | PRN
  Administered 2017-05-08 (×6): 25 ug via INTRAVENOUS

## 2017-05-08 MED ORDER — TIMOLOL MALEATE 0.5 % OP SOLN
1.0000 [drp] | Freq: Two times a day (BID) | OPHTHALMIC | Status: DC
  Administered 2017-05-08: 1 [drp] via OPHTHALMIC
  Filled 2017-05-08: qty 5

## 2017-05-08 MED ORDER — OXYCODONE HCL 5 MG/5ML PO SOLN: 5.0000 mg | Freq: Once | ORAL | Status: DC | PRN

## 2017-05-08 MED ORDER — LOSARTAN POTASSIUM 50 MG PO TABS
100.0000 mg | ORAL_TABLET | Freq: Every day | ORAL | Status: DC
  Administered 2017-05-08: 100 mg via ORAL
  Filled 2017-05-08: qty 2

## 2017-05-08 MED ORDER — MIDAZOLAM HCL 2 MG/2ML IJ SOLN
INTRAMUSCULAR | Status: AC
  Filled 2017-05-08: qty 2

## 2017-05-08 MED ORDER — LATANOPROST 0.005 % OP SOLN
1.0000 [drp] | Freq: Every day | OPHTHALMIC | Status: DC
  Administered 2017-05-08: 1 [drp] via OPHTHALMIC
  Filled 2017-05-08: qty 2.5

## 2017-05-08 MED ORDER — CHLORHEXIDINE GLUCONATE CLOTH 2 % EX PADS: 6.0000 | MEDICATED_PAD | Freq: Once | CUTANEOUS | Status: DC

## 2017-05-08 MED ORDER — MORPHINE SULFATE (PF) 4 MG/ML IV SOLN: 1.0000 mg | INTRAVENOUS | Status: DC | PRN

## 2017-05-08 MED ORDER — METHYLENE BLUE 0.5 % INJ SOLN
INTRAVENOUS | Status: AC
  Filled 2017-05-08: qty 10

## 2017-05-08 MED ORDER — ONDANSETRON HCL 4 MG/2ML IJ SOLN: 4.0000 mg | Freq: Four times a day (QID) | INTRAMUSCULAR | Status: DC | PRN

## 2017-05-08 MED ORDER — PANTOPRAZOLE SODIUM 40 MG IV SOLR
40.0000 mg | Freq: Every day | INTRAVENOUS | Status: DC
  Administered 2017-05-08: 40 mg via INTRAVENOUS
  Filled 2017-05-08: qty 40

## 2017-05-08 MED ORDER — BUPIVACAINE-EPINEPHRINE (PF) 0.5% -1:200000 IJ SOLN
INTRAMUSCULAR | Status: DC | PRN
  Administered 2017-05-08: 25 mL via PERINEURAL

## 2017-05-08 MED ORDER — CEFAZOLIN SODIUM-DEXTROSE 2-4 GM/100ML-% IV SOLN
2.0000 g | INTRAVENOUS | Status: AC
  Administered 2017-05-08: 2 g via INTRAVENOUS
  Filled 2017-05-08: qty 100

## 2017-05-08 MED ORDER — FENTANYL CITRATE (PF) 100 MCG/2ML IJ SOLN: 25.0000 ug | INTRAMUSCULAR | Status: DC | PRN

## 2017-05-08 MED ORDER — EPHEDRINE SULFATE 50 MG/ML IJ SOLN
INTRAMUSCULAR | Status: DC | PRN
  Administered 2017-05-08: 10 mg via INTRAVENOUS

## 2017-05-08 MED ORDER — SODIUM CHLORIDE 0.9 % IJ SOLN
INTRAMUSCULAR | Status: AC
  Filled 2017-05-08: qty 10

## 2017-05-08 MED ORDER — LIDOCAINE HCL (CARDIAC) 20 MG/ML IV SOLN
INTRAVENOUS | Status: DC | PRN
  Administered 2017-05-08: 40 mg via INTRAVENOUS

## 2017-05-08 MED ORDER — LOSARTAN POTASSIUM-HCTZ 100-25 MG PO TABS: 1.0000 | ORAL_TABLET | Freq: Every day | ORAL | Status: DC

## 2017-05-08 MED ORDER — DEXAMETHASONE SODIUM PHOSPHATE 10 MG/ML IJ SOLN
INTRAMUSCULAR | Status: DC | PRN
  Administered 2017-05-08: 10 mg via INTRAVENOUS

## 2017-05-08 MED ORDER — ONDANSETRON 4 MG PO TBDP: 4.0000 mg | ORAL_TABLET | Freq: Four times a day (QID) | ORAL | Status: DC | PRN

## 2017-05-08 MED ORDER — FENTANYL CITRATE (PF) 250 MCG/5ML IJ SOLN
INTRAMUSCULAR | Status: AC
  Filled 2017-05-08: qty 5

## 2017-05-08 MED ORDER — FENTANYL CITRATE (PF) 100 MCG/2ML IJ SOLN
INTRAMUSCULAR | Status: AC
  Filled 2017-05-08: qty 2

## 2017-05-08 SURGICAL SUPPLY — 56 items
ADH SKN CLS APL DERMABOND .7 (GAUZE/BANDAGES/DRESSINGS) ×1
APPLIER CLIP 9.375 MED OPEN (MISCELLANEOUS) ×2
APR CLP MED 9.3 20 MLT OPN (MISCELLANEOUS) ×1
BINDER BREAST LRG (GAUZE/BANDAGES/DRESSINGS) IMPLANT
BINDER BREAST XLRG (GAUZE/BANDAGES/DRESSINGS) ×2 IMPLANT
BIOPATCH RED 1 DISK 7.0 (GAUZE/BANDAGES/DRESSINGS) ×3 IMPLANT
CANISTER SUCT 3000ML PPV (MISCELLANEOUS) ×2 IMPLANT
CHLORAPREP W/TINT 26ML (MISCELLANEOUS) ×2 IMPLANT
CLIP APPLIE 9.375 MED OPEN (MISCELLANEOUS) ×1 IMPLANT
CONT SPEC 4OZ CLIKSEAL STRL BL (MISCELLANEOUS) ×2 IMPLANT
COVER PROBE W GEL 5X96 (DRAPES) ×2 IMPLANT
COVER SURGICAL LIGHT HANDLE (MISCELLANEOUS) ×2 IMPLANT
DERMABOND ADVANCED (GAUZE/BANDAGES/DRESSINGS) ×1
DERMABOND ADVANCED .7 DNX12 (GAUZE/BANDAGES/DRESSINGS) ×1 IMPLANT
DEVICE DISSECT PLASMABLAD 3.0S (MISCELLANEOUS) IMPLANT
DRAIN CHANNEL 19F RND (DRAIN) ×3 IMPLANT
DRAPE CHEST BREAST 15X10 FENES (DRAPES) ×2 IMPLANT
DRSG PAD ABDOMINAL 8X10 ST (GAUZE/BANDAGES/DRESSINGS) ×2 IMPLANT
DRSG TEGADERM 4X4.75 (GAUZE/BANDAGES/DRESSINGS) ×2 IMPLANT
ELECT CAUTERY BLADE 6.4 (BLADE) ×2 IMPLANT
ELECT REM PT RETURN 9FT ADLT (ELECTROSURGICAL) ×2
ELECTRODE REM PT RTRN 9FT ADLT (ELECTROSURGICAL) ×1 IMPLANT
EVACUATOR SILICONE 100CC (DRAIN) ×3 IMPLANT
GLOVE BIO SURGEON STRL SZ7.5 (GLOVE) ×2 IMPLANT
GLOVE BIOGEL PI IND STRL 7.0 (GLOVE) IMPLANT
GLOVE BIOGEL PI IND STRL 8.5 (GLOVE) IMPLANT
GLOVE BIOGEL PI INDICATOR 7.0 (GLOVE) ×1
GLOVE BIOGEL PI INDICATOR 8.5 (GLOVE) ×1
GLOVE ECLIPSE 6.5 STRL STRAW (GLOVE) ×1 IMPLANT
GLOVE ECLIPSE 8.0 STRL XLNG CF (GLOVE) ×1 IMPLANT
GOWN STRL REUS W/ TWL LRG LVL3 (GOWN DISPOSABLE) ×2 IMPLANT
GOWN STRL REUS W/TWL LRG LVL3 (GOWN DISPOSABLE) ×6
KIT BASIN OR (CUSTOM PROCEDURE TRAY) ×2 IMPLANT
KIT ROOM TURNOVER OR (KITS) ×2 IMPLANT
LIGHT WAVEGUIDE WIDE FLAT (MISCELLANEOUS) IMPLANT
NDL 18GX1X1/2 (RX/OR ONLY) (NEEDLE) IMPLANT
NDL FILTER BLUNT 18X1 1/2 (NEEDLE) IMPLANT
NDL HYPO 25GX1X1/2 BEV (NEEDLE) IMPLANT
NEEDLE 18GX1X1/2 (RX/OR ONLY) (NEEDLE) IMPLANT
NEEDLE FILTER BLUNT 18X 1/2SAF (NEEDLE)
NEEDLE FILTER BLUNT 18X1 1/2 (NEEDLE) IMPLANT
NEEDLE HYPO 25GX1X1/2 BEV (NEEDLE) IMPLANT
NS IRRIG 1000ML POUR BTL (IV SOLUTION) ×2 IMPLANT
PACK GENERAL/GYN (CUSTOM PROCEDURE TRAY) ×2 IMPLANT
PAD ARMBOARD 7.5X6 YLW CONV (MISCELLANEOUS) ×2 IMPLANT
PLASMABLADE 3.0S (MISCELLANEOUS) ×2
SPECIMEN JAR X LARGE (MISCELLANEOUS) ×2 IMPLANT
SUT ETHILON 3 0 FSL (SUTURE) ×3 IMPLANT
SUT MNCRL AB 4-0 PS2 18 (SUTURE) ×3 IMPLANT
SUT VIC AB 3-0 54X BRD REEL (SUTURE) IMPLANT
SUT VIC AB 3-0 BRD 54 (SUTURE)
SUT VIC AB 3-0 SH 18 (SUTURE) ×2 IMPLANT
SYR CONTROL 10ML LL (SYRINGE) IMPLANT
TOWEL OR 17X24 6PK STRL BLUE (TOWEL DISPOSABLE) ×2 IMPLANT
TOWEL OR 17X26 10 PK STRL BLUE (TOWEL DISPOSABLE) ×2 IMPLANT
TUBE CONNECTING 12X1/4 (SUCTIONS) IMPLANT

## 2017-05-08 NOTE — Anesthesia Preprocedure Evaluation (Signed)
Anesthesia Evaluation  Patient identified by MRN, date of birth, ID band Patient awake    Reviewed: Allergy & Precautions, NPO status , Patient's Chart, lab work & pertinent test results  History of Anesthesia Complications Negative for: history of anesthetic complications  Airway Mallampati: II  TM Distance: >3 FB Neck ROM: Full    Dental  (+) Teeth Intact   Pulmonary neg pulmonary ROS,    breath sounds clear to auscultation       Cardiovascular hypertension, Pt. on medications  Rhythm:Regular     Neuro/Psych negative neurological ROS  negative psych ROS   GI/Hepatic negative GI ROS, Neg liver ROS,   Endo/Other  negative endocrine ROS  Renal/GU negative Renal ROS     Musculoskeletal  (+) Arthritis ,   Abdominal   Peds  Hematology   Anesthesia Other Findings   Reproductive/Obstetrics                             Anesthesia Physical Anesthesia Plan  ASA: II  Anesthesia Plan: General   Post-op Pain Management:  Regional for Post-op pain   Induction: Intravenous  Airway Management Planned: LMA  Additional Equipment: None  Intra-op Plan:   Post-operative Plan: Extubation in OR  Informed Consent: I have reviewed the patients History and Physical, chart, labs and discussed the procedure including the risks, benefits and alternatives for the proposed anesthesia with the patient or authorized representative who has indicated his/her understanding and acceptance.   Dental advisory given  Plan Discussed with: CRNA and Surgeon  Anesthesia Plan Comments:         Anesthesia Quick Evaluation

## 2017-05-08 NOTE — H&P (Signed)
Amy [name]  Location: [facility] Patient #: [id number] DOB: 06/19/1946 Married / Language: Undefined / Race: Refused to Report/Unreported Female   History of Present Illness The patient is a 71 year old female who presents with breast cancer. We are asked to see the patient in consultation by Dr. Dorise Bullion to evaluate her for a new right breast cancer. The patient is a 71 year old white female who recently went for a routine screening mammogram. At that time she was found to have an abnormal area of microcalcification in the subareolar medial right breast. This area measured 1.5 cm. She denied any breast pain or discharge from the nipple. This area was biopsied and came back as ductal carcinoma in situ. Tumor markers are still pending. There was a comment by the pathologist that one area may have an area that is suspicious for early invasion. She does not smoke. She does have a significant family history of breast cancer in her sister at the age of 67.   Past Surgical History  Appendectomy  Breast Biopsy  Right. Cesarean Section - Multiple  Foot Surgery  Right. Hysterectomy (not due to cancer) - Partial  Knee Surgery  Bilateral. Tonsillectomy   Diagnostic Studies History  Colonoscopy  never Mammogram  within last year Pap Smear  1-5 years ago  Allergies  Demerol *ANALGESICS - OPIOID*  Swelling.  Medication History  Minivelle (0.075MG /24HR Patch TW, Transdermal) Active. Losartan Potassium-HCTZ (100-25MG  Tablet, Oral) Active. Latanoprost (0.005% Solution, Ophthalmic) Active. Timolol Maleate (0.5% Solution, Ophthalmic) Active. Medications Reconciled  Social History  Alcohol use  Occasional alcohol use. Caffeine use  Carbonated beverages, Coffee, Tea. No drug use  Tobacco use  Never smoker.  Family History  Breast Cancer  Sister. Hypertension  Mother, Sister. Melanoma  Family Members In General.  Pregnancy / Birth  History  Age at menarche  72 years. Age of menopause  60-50 Gravida  4 Length (months) of breastfeeding  7-12 Maternal age  25-20 Para  4  Other Problems  High blood pressure  Lump In Breast     Review of Systems  General Not Present- Appetite Loss, Chills, Fatigue, Fever, Night Sweats, Weight Gain and Weight Loss. Skin Not Present- Change in Wart/Mole, Dryness, Hives, Jaundice, New Lesions, Non-Healing Wounds, Rash and Ulcer. HEENT Present- Wears glasses/contact lenses. Not Present- Earache, Hearing Loss, Hoarseness, Nose Bleed, Oral Ulcers, Ringing in the Ears, Seasonal Allergies, Sinus Pain, Sore Throat, Visual Disturbances and Yellow Eyes. Respiratory Not Present- Bloody sputum, Chronic Cough, Difficulty Breathing, Snoring and Wheezing. Breast Not Present- Breast Mass, Breast Pain, Nipple Discharge and Skin Changes. Cardiovascular Present- Leg Cramps. Not Present- Chest Pain, Difficulty Breathing Lying Down, Palpitations, Rapid Heart Rate, Shortness of Breath and Swelling of Extremities. Gastrointestinal Not Present- Abdominal Pain, Bloating, Bloody Stool, Change in Bowel Habits, Chronic diarrhea, Constipation, Difficulty Swallowing, Excessive gas, Gets full quickly at meals, Hemorrhoids, Indigestion, Nausea, Rectal Pain and Vomiting. Female Genitourinary Not Present- Frequency, Nocturia, Painful Urination, Pelvic Pain and Urgency. Musculoskeletal Not Present- Back Pain, Joint Pain, Joint Stiffness, Muscle Pain, Muscle Weakness and Swelling of Extremities. Neurological Not Present- Decreased Memory, Fainting, Headaches, Numbness, Seizures, Tingling, Tremor, Trouble walking and Weakness. Psychiatric Not Present- Anxiety, Bipolar, Change in Sleep Pattern, Depression, Fearful and Frequent crying. Endocrine Not Present- Cold Intolerance, Excessive Hunger, Hair Changes, Heat Intolerance, Hot flashes and New Diabetes. Hematology Not Present- Blood Thinners, Easy Bruising, Excessive  bleeding, Gland problems, HIV and Persistent Infections.  Vitals  Weight: 195.6 lb Temp.: 98.6F  Pulse:  80 (Regular)  BP: 180/108 (Sitting, Left Arm, Standard)       Physical Exam  General Mental Status-Alert. General Appearance-Consistent with stated age. Hydration-Well hydrated. Voice-Normal.  Head and Neck Head-normocephalic, atraumatic with no lesions or palpable masses. Trachea-midline. Thyroid Gland Characteristics - normal size and consistency.  Eye Eyeball - Bilateral-Extraocular movements intact. Sclera/Conjunctiva - Bilateral-No scleral icterus.  Chest and Lung Exam Chest and lung exam reveals -quiet, even and easy respiratory effort with no use of accessory muscles and on auscultation, normal breath sounds, no adventitious sounds and normal vocal resonance. Inspection Chest Wall - Normal. Back - normal.  Breast Note: There is no palpable mass in either breast. There is no palpable axillary, supraclavicular, or cervical lymphadenopathy.   Cardiovascular Cardiovascular examination reveals -normal heart sounds, regular rate and rhythm with no murmurs and normal pedal pulses bilaterally.  Abdomen Inspection Inspection of the abdomen reveals - No Hernias. Skin - Scar - no surgical scars. Palpation/Percussion Palpation and Percussion of the abdomen reveal - Soft, Non Tender, No Rebound tenderness, No Rigidity (guarding) and No hepatosplenomegaly. Auscultation Auscultation of the abdomen reveals - Bowel sounds normal.  Neurologic Neurologic evaluation reveals -alert and oriented x 3 with no impairment of recent or remote memory. Mental Status-Normal.  Musculoskeletal Normal Exam - Left-Upper Extremity Strength Normal and Lower Extremity Strength Normal. Normal Exam - Right-Upper Extremity Strength Normal and Lower Extremity Strength Normal.  Lymphatic Head & Neck  General Head & Neck Lymphatics: Bilateral -  Description - Normal. Axillary  General Axillary Region: Bilateral - Description - Normal. Tenderness - Non Tender. Femoral & Inguinal  Generalized Femoral & Inguinal Lymphatics: Bilateral - Description - Normal. Tenderness - Non Tender.    Assessment & Plan  DUCTAL CARCINOMA IN SITU (DCIS) OF RIGHT BREAST (D05.11) Impression: The patient appears to have a small area of DCIS centrally in the medial right breast. I have discussed with her in detail today the different options for treatment and at this point she favors mastectomy. She is also a good candidate for sentinel node mapping. I have discussed with her in detail the risks and benefits of the operation to do this as well as some of the technical aspects and she understands and wishes to proceed Current Plans Referred to Oncology, for evaluation and follow up (Oncology). Routine. Pt Education - Breast cancer: discussed with patient and provided information.

## 2017-05-08 NOTE — Interval H&P Note (Signed)
History and Physical Interval Note:  05/08/2017 11:08 AM  Amy [name]  has presented today for surgery, with the diagnosis of RIGHT BREAST DCIS  The various methods of treatment have been discussed with the patient and family. After consideration of risks, benefits and other options for treatment, the patient has consented to  Procedure(s): RIGHT MASTECTOMY WITH SENTINEL LYMPH NODE BIOPSY (Right) as a surgical intervention .  The patient's history has been reviewed, patient examined, no change in status, stable for surgery.  I have reviewed the patient's chart and labs.  Questions were answered to the patient's satisfaction.     TOTH III,[name]

## 2017-05-08 NOTE — Anesthesia Procedure Notes (Signed)
Procedure Name: LMA Insertion Performed by: Christofer Shen B Pre-anesthesia Checklist: Patient identified, Emergency Drugs available, Suction available, Patient being monitored and Timeout performed Patient Re-evaluated:Patient Re-evaluated prior to inductionOxygen Delivery Method: Circle system utilized Preoxygenation: Pre-oxygenation with 100% oxygen Intubation Type: IV induction LMA: LMA inserted LMA Size: 4.0 Number of attempts: 1 Placement Confirmation: positive ETCO2 and breath sounds checked- equal and bilateral Tube secured with: Tape Dental Injury: Teeth and Oropharynx as per pre-operative assessment        

## 2017-05-08 NOTE — Anesthesia Procedure Notes (Signed)
Anesthesia Regional Block: Pectoralis block   Pre-Anesthetic Checklist: ,, timeout performed, Correct Patient, Correct Site, Correct Laterality, Correct Procedure, Correct Position, risks and benefits discussed, surgical consent, pre-op evaluation,  At surgeon's request and post-op pain management  Laterality: Right  Prep: chloraprep       Needles:  Injection technique: Single-shot  Needle Type: Echogenic Needle          Additional Needles:   Procedures: ultrasound guided,,,,,,,,  Narrative:  Start time: 05/08/2017 11:18 AM End time: 05/08/2017 11:23 AM Injection made incrementally with aspirations every 5 mL.  Performed by: Personally   Additional Notes: H+P and labs reviewed, risks and benefits discussed with patient, procedure tolerated well without complications

## 2017-05-08 NOTE — Anesthesia Postprocedure Evaluation (Addendum)
Anesthesia Post Note  Patient: Amy [name]  Procedure(s) Performed: Procedure(s) (LRB): RIGHT MASTECTOMY WITH SENTINEL LYMPH NODE BIOPSY (Right)  Patient location during evaluation: PACU Anesthesia Type: Regional Level of consciousness: awake and alert Pain management: pain level controlled Vital Signs Assessment: post-procedure vital signs reviewed and stable Respiratory status: spontaneous breathing, nonlabored ventilation, respiratory function stable and patient connected to nasal cannula oxygen Cardiovascular status: blood pressure returned to baseline and stable Postop Assessment: no signs of nausea or vomiting Anesthetic complications: no       Last Vitals:  Vitals:   05/08/17 1443 05/08/17 1456  BP: 125/67 135/65  Pulse: 69 67  Resp: 12 17  Temp: 36.6 C 37 C    Last Pain:  Vitals:   05/08/17 1456  TempSrc: Oral  PainSc:                  [name]

## 2017-05-08 NOTE — Transfer of Care (Signed)
Immediate Anesthesia Transfer of Care Note  Patient: Amy [name]  Procedure(s) Performed: Procedure(s): RIGHT MASTECTOMY WITH SENTINEL LYMPH NODE BIOPSY (Right)  Patient Location: PACU  Anesthesia Type:GA combined with regional for post-op pain  Level of Consciousness: awake and alert   Airway & Oxygen Therapy: Patient Spontanous Breathing  Post-op Assessment: Report given to RN and Post -op Vital signs reviewed and stable  Post vital signs: Reviewed and stable  Last Vitals:  Vitals:   05/08/17 1110 05/08/17 1345  BP: (!) 127/58 (!) (P) 148/77  Pulse: 67 (P) 77  Resp: (!) 9 (P) 17  Temp:  (P) 36.4 C    Last Pain:  Vitals:   05/08/17 1345  TempSrc:   PainSc: (P) 5          Complications: No apparent anesthesia complications

## 2017-05-08 NOTE — Op Note (Signed)
05/08/2017  1:45 PM  PATIENT:  Amy [name]  71 y.o. female  PRE-OPERATIVE DIAGNOSIS:  RIGHT BREAST DCIS  POST-OPERATIVE DIAGNOSIS:  RIGHT BREAST DCIS  PROCEDURE:  Procedure(s): RIGHT MASTECTOMY WITH DEEP RIGHT AXILLARY SENTINEL LYMPH NODE BIOPSY (Right)  SURGEON:  Surgeon(s) and Role:    * Jovita Kussmaul, MD - Primary  PHYSICIAN ASSISTANT:   ASSISTANTS: none   ANESTHESIA:   general  EBL:  Total I/O In: 1000 [I.V.:1000] Out: 20 [Blood:20]  BLOOD ADMINISTERED:none  DRAINS: (2) Jackson-Pratt drain(s) with closed bulb suction in the PREPECTORAL SPACE   LOCAL MEDICATIONS USED:  NONE  SPECIMEN:  Source of Specimen:  right mastectomy and sentinel nodes X 2  DISPOSITION OF SPECIMEN:  PATHOLOGY  COUNTS:  YES  TOURNIQUET:  * No tourniquets in log *  DICTATION: .Dragon Dictation   After informed consent was obtained the patient was brought to the operating room and placed in the supine position on the operating table. After adequate induction of general anesthesia the patient's right chest, breast, and axillary area were prepped with ChloraPrep, allowed to dry, and draped in usual sterile manner. An appropriate timeout was performed. Earlier in the day the patient underwent injection of 1 mCi of technetium sulfur colloid in the subareolar position on the right. At this point an elliptical incision was made around the nipple and areola complex in order to minimize the excess skin. The incision was carried through the skin and subcutaneous tissue sharply with plasma blade. Breast hooks were used to elevate skin flaps anteriorly towards the ceiling. Skin flaps were then created circumferentially between the breast tissue and subcutaneous fat. This dissection was carried circumferentially all the way to the chest wall. Once this was accomplished then the breast was removed from the pectoralis muscle with the pectoralis fascia and a top-down technique. Once the breast was removed it  was oriented with a stitch on the lateral skin. The neoprobe was then used to direct sharp dissection with the plasma blade into the deep right axillary space. Blunt dissection was carried out under the direction of the neoprobe until 2 hot lymph nodes were identified. These lymph nodes were excised sharply with the plasma blade. Ex vivo Counts on these sentinel nodes were approximately 400. These were sent to pathology for further evaluation. Hemostasis was achieved using the plasma blade. The wound was then irrigated with copious amounts of saline. 2 small stab incisions were made near the anterior axillary line inferior to the operative bed. A tonsil clamp was placed through each of these openings and used to bring a 19 [place] round Blake drain into the operative bed. The medial drain was curled along the chest wall and the lateral drain was placed in the axilla. The drains were anchored to the skin with 3-0 nylon stitches. Next the superior and inferior skin flaps were grossly reapproximated with interrupted 3-0 Vicryl stitches. The skin was then closed with a running 4-0 Monocryl subcuticular stitch. Dermabond dressings were applied as well as other sterile dressings.. The drains were placed to bulb suction and there was a good seal. The patient tolerated the procedure well. At the end of the case all needle sponge and instrument counts were correct. The patient was then awakened and taken to recovery in stable condition.  PLAN OF CARE: Admit for overnight observation  PATIENT DISPOSITION:  PACU - hemodynamically stable.   Delay start of Pharmacological VTE agent (>24hrs) due to surgical blood loss or risk of bleeding: no

## 2017-05-09 ENCOUNTER — Encounter (HOSPITAL_COMMUNITY): Payer: Self-pay | Admitting: General Surgery

## 2017-05-09 DIAGNOSIS — Z90711 Acquired absence of uterus with remaining cervical stump: Secondary | ICD-10-CM | POA: Diagnosis not present

## 2017-05-09 DIAGNOSIS — D0511 Intraductal carcinoma in situ of right breast: Secondary | ICD-10-CM | POA: Diagnosis not present

## 2017-05-09 DIAGNOSIS — I10 Essential (primary) hypertension: Secondary | ICD-10-CM | POA: Diagnosis not present

## 2017-05-09 DIAGNOSIS — Z803 Family history of malignant neoplasm of breast: Secondary | ICD-10-CM | POA: Diagnosis not present

## 2017-05-09 DIAGNOSIS — Z79899 Other long term (current) drug therapy: Secondary | ICD-10-CM | POA: Diagnosis not present

## 2017-05-09 MED ORDER — OXYCODONE HCL 5 MG PO TABS: 5.0000 mg | ORAL_TABLET | Freq: Four times a day (QID) | ORAL | 0 refills | Status: DC | PRN

## 2017-05-09 MED ORDER — PANTOPRAZOLE SODIUM 40 MG PO TBEC: 40.0000 mg | DELAYED_RELEASE_TABLET | Freq: Every day | ORAL | Status: DC

## 2017-05-09 NOTE — Discharge Instructions (Signed)
[facility] [place] °[phone] ° °MASTECTOMY: POST OP INSTRUCTIONS ° °Always review your discharge instruction sheet given to you by the facility where your surgery was performed. °IF YOU HAVE DISABILITY OR FAMILY LEAVE FORMS, YOU MUST BRING THEM TO THE OFFICE FOR PROCESSING.   °DO NOT GIVE THEM TO YOUR DOCTOR. °A prescription for pain medication may be given to you upon discharge.  Take your pain medication as prescribed, if needed.  If narcotic pain medicine is not needed, then you may take acetaminophen (Tylenol) or ibuprofen (Advil) as needed. °1. Take your usually prescribed medications unless otherwise directed. °2. If you need a refill on your pain medication, please contact your pharmacy.  They will contact our office to request authorization.  Prescriptions will not be filled after 5pm or on week-ends. °3. You should follow a light diet the first few days after arrival home, such as soup and crackers, etc.  Resume your normal diet the day after surgery. °4. Most patients will experience some swelling and bruising on the chest and underarm.  Ice packs will help.  Swelling and bruising can take several days to resolve.  °5. It is common to experience some constipation if taking pain medication after surgery.  Increasing fluid intake and taking a stool softener (such as Colace) will usually help or prevent this problem from occurring.  A mild laxative (Milk of Magnesia or Miralax) should be taken according to package instructions if there are no bowel movements after 48 hours. °6. Unless discharge instructions indicate otherwise, leave your bandage dry and in place until your next appointment in 3-5 days.  You may take a limited sponge bath.  No tube baths or showers until the drains are removed.  You may have steri-strips (small skin tapes) in place directly over the incision.  These strips should be left on the skin for 7-10 days.  If your surgeon used skin glue on the incision, you may  shower in 24 hours.  The glue will flake off over the next 2-3 weeks.  Any sutures or staples will be removed at the office during your follow-up visit. °7. DRAINS:  If you have drains in place, it is important to keep a list of the amount of drainage produced each day in your drains.  Before leaving the hospital, you should be instructed on drain care.  Call our office if you have any questions about your drains. °8. ACTIVITIES:  You may resume regular (light) daily activities beginning the next day--such as daily self-care, walking, climbing stairs--gradually increasing activities as tolerated.  You may have sexual intercourse when it is comfortable.  Refrain from any heavy lifting or straining until approved by your doctor. °a. You may drive when you are no longer taking prescription pain medication, you can comfortably wear a seatbelt, and you can safely maneuver your car and apply brakes. °b. RETURN TO WORK:  __________________________________________________________ °9. You should see your doctor in the office for a follow-up appointment approximately 3-5 days after your surgery.  Your doctor’s nurse will typically make your follow-up appointment when she calls you with your pathology report.  Expect your pathology report 2-3 business days after your surgery.  You may call to check if you do not hear from us after three days.   °10. OTHER INSTRUCTIONS: ______________________________________________________________________________________________ ____________________________________________________________________________________________ °WHEN TO CALL YOUR DOCTOR: °1. Fever over 101.0 °2. Nausea and/or vomiting °3. Extreme swelling or bruising °4. Continued bleeding from incision. °5. Increased pain, redness, or drainage from the incision. °  The clinic staff is available to answer your questions during regular business hours.  Please dont hesitate to call and ask to speak to one of the nurses for clinical  concerns.  If you have a medical emergency, go to the nearest emergency room or call 911.  A surgeon from [facility] is always on call at the hospital. [address] [place], [place], [place]  [address] ? [address], [place], [place]   [address] [phone] ? [phone] ? FAX [phone] Web site: [place] Surgical drains are used to remove extra fluid that normally builds up in a surgical wound after surgery. A surgical drain helps to heal a surgical wound. Different kinds of surgical drains include:  Active drains. These drains use suction to pull drainage away from the surgical wound. Drainage flows through a tube to a container outside of the body. It is important to keep the bulb or the drainage container flat (compressed) at all times, except while you empty it. Flattening the bulb or container creates suction. The two most common types of active drains are bulb drains and Hemovac drains.  Passive drains. These drains allow fluid to drain naturally, by gravity. Drainage flows through a tube to a bandage (dressing) or a container outside of the body. Passive drains do not need to be emptied. The most common type of passive drain is the Penrose drain. A drain is placed during surgery. Immediately after surgery, drainage is usually bright red and a little thicker than water. The drainage may gradually turn yellow or pink and become thinner. It is likely that your health care provider will remove the drain when the drainage stops or when the amount decreases to 1-2 Tbsp (15-30 mL) during a 24-hour period. How to care for your surgical drain  Keep the skin around the drain dry and covered with a dressing at all times.  Check your drain area every day for signs of infection. Check for:  More redness, swelling, or pain.  Pus or a bad smell.  Cloudy drainage. Follow instructions from your health care provider about how to take care of  your drain and how to change your dressing. Change your dressing at least one time every day. Change it more often if needed to keep the dressing dry. Make sure you: 1. Gather your supplies, including:  Tape.  Germ-free cleaning solution (sterile saline).  Split gauze drain sponge: 4 x 4 inches (10 x 10 cm).  Gauze square: 4 x 4 inches (10 x 10 cm). 2. Wash your hands with soap and water before you change your dressing. If soap and water are not available, use hand sanitizer. 3. Remove the old dressing. Avoid using scissors to do that. 4. Use sterile saline to clean your skin around the drain. 5. Place the tube through the slit in a drain sponge. Place the drain sponge so that it covers your wound. 6. Place the gauze square or another drain sponge on top of the drain sponge that is on the wound. Make sure the tube is between those layers. 7. Tape the dressing to your skin. 8. If you have an active bulb or Hemovac drain, tape the drainage tube to your skin 1-2 inches (2.5-5 cm) below the place where the tube enters your body. Taping keeps the tube from pulling on any stitches (sutures) that you have. 9. Wash your hands with soap and water. 10. Write down the color of your  drainage and how often you change your dressing. How to empty your active bulb or Hemovac drain 1. Make sure that you have a measuring cup that you can empty your drainage into. 2. Wash your hands with soap and water. If soap and water are not available, use hand sanitizer. 3. Gently move your fingers down the tube while squeezing very lightly. This is called stripping the tube. This clears any drainage, clots, or tissue from the tube.  Do not pull on the tube.  You may need to strip the tube several times every day to keep the tube clear. 4. Open the bulb cap or the drain plug. Do not touch the inside of the cap or the bottom of the plug. 5. Empty all of the drainage into the measuring cup. 6. Compress the bulb or the  container and replace the cap or the plug. To compress the bulb or the container, squeeze it firmly in the middle while you close the cap or plug the container. 7. Write down the amount of drainage that you have in each 24-hour period. If you have less than 2 Tbsp (30 mL) of drainage during 24 hours, contact your health care provider. 8. Flush the drainage down the toilet. 9. Wash your hands with soap and water. Contact a health care provider if:  You have more redness, swelling, or pain around your drain area.  The amount of drainage that you have is increasing instead of decreasing.  You have pus or a bad smell coming from your drain area.  You have a fever.  You have drainage that is cloudy.  There is a sudden stop or a sudden decrease in the amount of drainage that you have.  Your tube falls out.  Your active draindoes not stay compressedafter you empty it. This information is not intended to replace advice given to you by your health care provider. Make sure you discuss any questions you have with your health care provider. Document Released: 12/05/2000 Document Revised: 05/15/2016 Document Reviewed: 06/27/2015 Elsevier Interactive Patient Education  2017 [employer].

## 2017-05-09 NOTE — Progress Notes (Signed)
Pt discharged to home accompanied by daughters. Additional pink breast binder given to pt prior to DC. Breast cancer bag given and explained to pt.  JP measuring cup reviewed with pt and daughter as well as how to empty the JP drain, measure it and record it on the chart to take to Dr. Marlou Starks for follow up.  Pt understands her appointments for follow up with the oncologist and Dr. Marlou Starks and understands what to call the MD for if any complications arise.

## 2017-05-09 NOTE — Discharge Summary (Signed)
Physician Discharge Summary  Amy [name] FYB:[id number] DOB: 1946-11-30 DOA: 05/08/2017  PCP: Corine Shelter, PA-C  Admit date: 05/08/2017 Discharge date: 05/09/2017  Recommendations for Outpatient Follow-up:  1.   Follow-up Information    Autumn Messing III, MD. Go in 4 day(s).   Specialty:  General Surgery Why:  office will call you with appt to see dr toth this week in office Contact information: [address] [place] [address] [phone]          Discharge Diagnoses:  1. Right breast DCIS  Surgical Procedure: Right mastectomy with right SLN Bx  Discharge Condition: good Disposition: home  Diet recommendation: regular  Filed Weights   05/08/17 0900 05/08/17 1456  Weight: 89.1 kg (196 lb 8 oz) 82.6 kg (182 lb)    History of present illness:  The patient is a 71 year old female who presents with breast cancer. We are asked to see the patient in consultation by Dr. Dorise Bullion to evaluate her for a new right breast cancer. The patient is a 71 year old white female who recently went for a routine screening mammogram. At that time she was found to have an abnormal area of microcalcification in the subareolar medial right breast. This area measured 1.5 cm. She denied any breast pain or discharge from the nipple. This area was biopsied and came back as ductal carcinoma in situ. Tumor markers are still pending. There was a comment by the pathologist that one area may have an area that is suspicious for early invasion. She does not smoke. She does have a significant family history of breast cancer in her sister at the age of 13.  Hospital Course:  Brought in for planned mastectomy. Kept overnight for observation. On day of discharge doing well. Pain controlled. No n/v. Tolerating a diet, ambulated in halls. Vitals stable. Instructed on drain care. Drain output-serosang, and output decreasing. Discussed dc instructions  BP (!) 126/57 (BP Location: Left  Arm)   Pulse 63   Temp 98.7 F (37.1 C) (Oral)   Resp 17   Ht 5\' 8"  (1.727 m)   Wt 82.6 kg (182 lb)   SpO2 100%   BMI 27.67 kg/m   Gen: alert, NAD, non-toxic appearing Pupils: equal, no scleral icterus Pulm: Lungs clear to auscultation, symmetric chest rise CV: regular rate and rhythm Abd: soft, nontender, nondistended. Ext: no edema, no calf tenderness Skin: no rash, no jaundice Chest: right chest wall - incision c/d/i, no hematoma, drains-serosang    Discharge Instructions  Discharge Instructions    Call MD for:    Complete by:  As directed    Temperature >101   Call MD for:  hives    Complete by:  As directed    Call MD for:  persistant dizziness or light-headedness    Complete by:  As directed    Call MD for:  persistant nausea and vomiting    Complete by:  As directed    Call MD for:  redness, tenderness, or signs of infection (pain, swelling, redness, odor or green/yellow discharge around incision site)    Complete by:  As directed    Call MD for:  severe uncontrolled pain    Complete by:  As directed    Diet - low sodium heart healthy    Complete by:  As directed    Discharge instructions    Complete by:  As directed    See [facility] discharge instructions   Discharge wound care:  Complete by:  As directed    See [facility] discharge instructions   Increase activity slowly    Complete by:  As directed      Allergies as of 05/09/2017      Reactions   Meperidine Swelling   Facial swelling      Medication List    TAKE these medications   latanoprost 0.005 % ophthalmic solution Commonly known as:  XALATAN Place 1 drop into both eyes at bedtime.   losartan-hydrochlorothiazide 100-25 MG tablet Commonly known as:  HYZAAR Take 1 tablet by mouth daily before breakfast.   oxyCODONE 5 MG immediate release tablet Commonly known as:  Oxy IR/ROXICODONE Take 1 tablet (5 mg total) by mouth every 6 (six) hours as needed for moderate pain, severe pain or breakthrough  pain.   timolol 0.5 % ophthalmic solution Commonly known as:  TIMOPTIC Place 1 drop into both eyes 2 (two) times daily.      Follow-up Information    Autumn Messing III, MD. Go in 4 day(s).   Specialty:  General Surgery Why:  office will call you with appt to see dr toth this week in office Contact information: [place] [address] [phone]            The results of significant diagnostics from this hospitalization (including imaging, microbiology, ancillary and laboratory) are listed below for reference.    Significant Diagnostic Studies: Mm Digital Diagnostic Unilat R  Result Date: 04/14/2017 CLINICAL DATA:  Callback from screening mammogram for calcifications right breast EXAM: DIGITAL DIAGNOSTIC RIGHT MAMMOGRAM WITH CAD COMPARISON:  Previous exam(s). ACR Breast Density Category b: There are scattered areas of fibroglandular density. FINDINGS: Spot magnification CC and lateral views of the right breast, lateral view of right breast are submitted. There is a group of indeterminate microcalcifications in the medial retroareolar right breast measuring 1.6 x 1.2 x 0.9 cm. Mammographic images were processed with CAD. IMPRESSION: Suspicious findings. RECOMMENDATION: Stereotactic core biopsy of right breast calcifications. I have discussed the findings and recommendations with the patient. Results were also provided in writing at the conclusion of the visit. If applicable, a reminder letter will be sent to the patient regarding the next appointment. BI-RADS CATEGORY  4: Suspicious. Electronically Signed   By: Abelardo Diesel M.D.   On: 04/14/2017 08:58   Mm Clip Placement Right  Result Date: 04/16/2017 CLINICAL DATA:  Evaluate biopsy clip placement EXAM: DIAGNOSTIC RIGHT MAMMOGRAM POST STEREOTACTIC BIOPSY COMPARISON:  Previous exam(s). FINDINGS: Mammographic images were obtained following stereotactic guided biopsy of right breast calcifications. The coil shaped  biopsy clip is in good position. IMPRESSION: Appropriate clip placement. Final Assessment: Post Procedure Mammograms for Marker Placement Electronically Signed   By: Dorise Bullion III M.D   On: 04/16/2017 10:12   Mm Rt Breast Bx Johnella Moloney Dev 1st Lesion Image Bx Spec Stereo Guide  Addendum Date: 04/22/2017   ADDENDUM REPORT: 04/20/2017 08:38 ADDENDUM: Pathology revealed HIGH GRADE DUCTAL CARCINOMA IN SITU WITH CALCIFICATIONS AND NECROSIS of the Right breast, medial superior. This was found to be concordant by Dr. Dorise Bullion. Pathology results were discussed with the patient by telephone. The patient reported doing well after the biopsy with tenderness at the site. Post biopsy instructions and care were reviewed and questions were answered. The patient was encouraged to call [facility] for any additional concerns. Surgical consultation has been arranged with Dr. Autumn Messing at [facility] on April 20, 2017.  Pathology results reported by Terie Purser, RN on 04/20/2017. Electronically Signed   By: Dorise Bullion III M.D   On: 04/20/2017 08:38   Result Date: 04/22/2017 CLINICAL DATA:  Right breast calcifications EXAM: RIGHT BREAST STEREOTACTIC CORE NEEDLE BIOPSY COMPARISON:  Previous exams. FINDINGS: The patient and I discussed the procedure of stereotactic-guided biopsy including benefits and alternatives. We discussed the high likelihood of a successful procedure. We discussed the risks of the procedure including infection, bleeding, tissue injury, clip migration, and inadequate sampling. Informed written consent was given. The usual time out protocol was performed immediately prior to the procedure. Using sterile technique and 1% Lidocaine as local anesthetic, under stereotactic guidance, a 9 gauge vacuum assisted device was used to perform core needle biopsy of calcifications in the medial right breast using a medial approach. Specimen radiograph was performed showing  calcifications in 2 core specimens. Specimens with calcifications are identified for pathology. Lesion quadrant: Medial superior At the conclusion of the procedure, a coil shaped tissue marker clip was deployed into the biopsy cavity. Follow-up 2-view mammogram was performed and dictated separately. IMPRESSION: Stereotactic-guided biopsy of right breast calcifications. No apparent complications. Electronically Signed: By: Dorise Bullion III M.D On: 04/16/2017 09:58    Microbiology: No results found for this or any previous visit (from the past 240 hour(s)).   Labs: Basic Metabolic Panel: No results for input(s): NA, K, CL, CO2, GLUCOSE, BUN, CREATININE, CALCIUM, MG, PHOS in the last 168 hours. Liver Function Tests: No results for input(s): AST, ALT, ALKPHOS, BILITOT, PROT, ALBUMIN in the last 168 hours. No results for input(s): LIPASE, AMYLASE in the last 168 hours. No results for input(s): AMMONIA in the last 168 hours. CBC: No results for input(s): WBC, NEUTROABS, HGB, HCT, MCV, PLT in the last 168 hours. Cardiac Enzymes: No results for input(s): CKTOTAL, CKMB, CKMBINDEX, TROPONINI in the last 168 hours. BNP: BNP (last 3 results) No results for input(s): BNP in the last 8760 hours.  ProBNP (last 3 results) No results for input(s): PROBNP in the last 8760 hours.  CBG: No results for input(s): GLUCAP in the last 168 hours.  Active Problems:   Ductal carcinoma in situ (DCIS) of right breast   Time coordinating discharge: 15 min  Signed:  Gayland Curry, MD [facility], [place] [phone] 05/09/2017, 8:46 AM

## 2017-05-09 NOTE — Care Management Note (Signed)
Case Management Note  Patient Details  Name: PANTERA WINTERROWD MRN: [id number] Date of Birth: Sep 02, 1946  Subjective/Objective:                 OIB. Order to DC to home, no CM consults, orders, or needs identified.    Action/Plan:  DC to home.  Expected Discharge Date:  05/09/17               Expected Discharge Plan:  Home/Self Care  In-House Referral:     Discharge planning Services  CM Consult  Post Acute Care Choice:    Choice offered to:     DME Arranged:    DME Agency:     HH Arranged:    HH Agency:     Status of Service:  Completed, signed off  If discussed at [employer] of Stay Meetings, dates discussed:    Additional Comments:  Carles Collet, RN 05/09/2017, 9:02 AM

## 2017-05-15 NOTE — Progress Notes (Signed)
[facility]  Telephone:([phone]) [phone] Fax:([phone]) [phone]  Clinic New Consult Note   Patient Care Team: Corine Shelter, Hershal Coria as PCP - General (Physician Assistant) 05/22/2017  REFERRAL PHYSICIAN: Dr. Marlou Starks    CHIEF COMPLAINTS/PURPOSE OF CONSULTATION:  Ductal carcinoma in situ (DCIS) of right breast    Ductal carcinoma in situ (DCIS) of right breast   04/14/2017 Mammogram    MM Diagnostic Unilat R   IMPRESSION: Suspicious findings.  Addendum (04/20/2017): Pathology revealed HIGH GRADE DUCTAL CARCINOMA IN SITU WITH CALCIFICATIONS AND NECROSIS of the Right breast, medial superior.       04/16/2017 Pathology Results    Surgical pathology Diagnosis Breast, right, needle core biopsy, medial superior - HIGH GRADE DUCTAL CARCINOMA IN SITU WITH CALCIFICATIONS AND NECROSIS. - SEE MICROSCOPIC DESCRIPTION        04/20/2017 Mammogram    MM Breast  Pathology revealed HIGH GRADE DUCTAL CARCINOMA IN SITU WITH CALCIFICATIONS AND NECROSIS of the Right breast, medial superior.       05/08/2017 Initial Diagnosis    Ductal carcinoma in situ (DCIS) of right breast      HISTORY OF PRESENTING ILLNESS:  Amy [name] 71 y.o. female is here because of Ductal carcinoma in situ (DCIS) of right breast. She was referred by Dr. Marlou Starks.  She recently went in for [name] routine screening mammogram and found an abnormal area of microcalcification in the subareolar medial right breast. This area measured 1.5 cm. She denied any breast pain or discharge from the nipple. She did not feel the lump and it was discovered by mammogram. She has been doing well. She is very adamant about quality of life over quantity of life. The area was biopsied and came back as [name] ductal carcinoma insitu. The tumor markers are still pending. There was [name] comment by the pathologist that one area may have an area that is suspicious for early invasion. She does not smoke.  She does have [name] significant family history of  breast cancer in her sister at the age of 35.  She has recently received [name] right mastectomy on 05/08/2017.  About 10 years ago her mammogram looked suspicious, but turned out fine.  She had [name] partial hysterectomy at around 45. She took hormonal replacements which she recently stopped.    Her husband brought her [name] beach house and [name] new car for their 50th wedding anniversary.  She never smoked, but drinks on occasion.   GYN HISTORY  Menarchal: 11 LMP: in early 60's  Contraceptive: none  HRT: 20 years, stopped recently  G4P4: 3 daughters and one son    MEDICAL HISTORY:  Past Medical History:  Diagnosis Date  . Arthritis   . Hypertension   . Skin cancer     SURGICAL HISTORY: Past Surgical History:  Procedure Laterality Date  . ABDOMINAL HYSTERECTOMY    . FACIAL COSMETIC SURGERY     "eye lid surgery"  . KNEE ARTHROSCOPY Bilateral    torn meniscus on bilateral knees  . MASTECTOMY COMPLETE / SIMPLE W/ SENTINEL NODE BIOPSY Right 05/08/2017  . MASTECTOMY W/ SENTINEL NODE BIOPSY Right 05/08/2017   Procedure: RIGHT MASTECTOMY WITH SENTINEL LYMPH NODE BIOPSY;  Surgeon: Jovita Kussmaul, MD;  Location: [facility];  Service: General;  Laterality: Right;  . NOSE SURGERY     deviated septum   . titanium ear implant Left    "for hearing" left, Dr. Thornell Mule    SOCIAL HISTORY: Social History   Social History  . Marital status:  Married    Spouse name: N/[name]  . Number of children: N/[name]  . Years of education: N/[name]   Occupational History  . Not on file.   Social History Main Topics  . Smoking status: Never Smoker  . Smokeless tobacco: Never Used  . Alcohol use Yes     Comment: occasionally [name] glass of wine   . Drug use: No  . Sexual activity: Not on file   Other Topics Concern  . Not on file   Social History Narrative  . No narrative on file    FAMILY HISTORY: Family History  Problem Relation Age of Onset  . Cancer Father 15       prostate cancer   . Cancer Sister 75        breast cancer      Father had prostate cancer Sister had breast cancer Has 3 children  ALLERGIES:  is allergic to meperidine and other.  MEDICATIONS:  Current Outpatient Prescriptions  Medication Sig Dispense Refill  . latanoprost (XALATAN) 0.005 % ophthalmic solution Place 1 drop into both eyes at bedtime.    Marland Kitchen losartan-hydrochlorothiazide (HYZAAR) 100-25 MG tablet Take 1 tablet by mouth daily before breakfast.    . timolol (TIMOPTIC) 0.5 % ophthalmic solution Place 1 drop into both eyes 2 (two) times daily.    Marland Kitchen oxyCODONE (OXY IR/ROXICODONE) 5 MG immediate release tablet Take 1 tablet (5 mg total) by mouth every 6 (six) hours as needed for moderate pain, severe pain or breakthrough pain. (Patient not taking: Reported on 05/22/2017) 30 tablet 0   No current facility-administered medications for this visit.     REVIEW OF SYSTEMS:   Constitutional: Denies fevers, chills or abnormal night sweats Eyes: Denies blurriness of vision, double vision or watery eyes Ears, nose, mouth, throat, and face: Denies mucositis or sore throat Respiratory: Denies cough, dyspnea or wheezes Cardiovascular: Denies palpitation, chest discomfort or lower extremity swelling Gastrointestinal:  Denies nausea, heartburn or change in bowel habits Skin: Denies abnormal skin rashes Lymphatics: Denies new lymphadenopathy or easy bruising Neurological:Denies numbness, tingling or new weaknesses Behavioral/Psych: Mood is stable, no new changes  All other systems were reviewed with the patient and are negative.  PHYSICAL EXAMINATION: ECOG PERFORMANCE STATUS: 0 - Asymptomatic  Vitals:   05/22/17 1100  BP: (!) 149/63  Pulse: 74  Resp: 20  Temp: 98.2 F (36.8 C)   Filed Weights   05/22/17 1100  Weight: 194 lb 12.8 oz (88.4 kg)    GENERAL:alert, no distress and comfortable SKIN: skin color, texture, turgor are normal, no rashes or significant lesions EYES: normal, conjunctiva are pink and non-injected,  sclera clear OROPHARYNX:no exudate, no erythema and lips, buccal mucosa, and tongue normal  NECK: supple, thyroid normal size, non-tender, without nodularity LYMPH:  no palpable lymphadenopathy in the cervical, axillary or inguinal LUNGS: clear to auscultation and percussion with normal breathing effort HEART: regular rate & rhythm and no murmurs and no lower extremity edema ABDOMEN:abdomen soft, non-tender and normal bowel sounds Musculoskeletal:no cyanosis of digits and no clubbing  PSYCH: alert & oriented x 3 with fluent speech NEURO: no focal motor/sensory deficits Breasts: s/p right mastectomy, surgical incision is healing well, one drain tube in place. Palpation of the left breast and b/l axilla revealed no obvious mass that I could appreciate.    LABORATORY DATA:  I have reviewed the data as listed CBC Latest Ref Rng & Units 04/27/2017  WBC 4.0 - 10.5 K/uL 5.9  Hemoglobin 12.0 - 15.0 g/dL  12.1  Hematocrit 36.0 - 46.0 % 35.7(L)  Platelets 150 - 400 K/uL 298    PATHOLOGY RESULTS Surgical Pathology 05/08/2017 Diagnosis 1. Breast, simple mastectomy, Right - DUCTAL CARCINOMA IN SITU WITH CALCIFICATIONS, HIGH GRADE SPANNING 0.6 CM. - THE SURGICAL RESECTION MARGINS ARE NEGATIVE FOR CARCINOMA. - SEE ONCOLOGY TABLE BELOW. 2. Lymph node, sentinel, biopsy, Right axillary #1 - THERE IS NO EVIDENCE OF CARCINOMA IN 1 OF 1 LYMPH NODE (0/1). 3. Lymph node, sentinel, biopsy, Right axillary #2 - THERE IS NO EVIDENCE OF CARCINOMA IN 1 OF 1 LYMPH NODE (0/1). 4. Lymph node, sentinel, biopsy, Right axillary #3 - THERE IS NO EVIDENCE OF CARCINOMA IN 1 OF 1 LYMPH NODE (0/1). 5. Lymph node, sentinel, biopsy, Right axillary #4 - THERE IS NO EVIDENCE OF CARCINOMA IN 1 OF 1 LYMPH NODE (0/1). Microscopic Comment 1. BREAST, IN SITU CARCINOMA Specimen, including laterality: Right breast and right axillary lymph nodes. Procedure (include lymph node sampling sentinel-non-sentinel: Simple mastectomy and  axillary lymph node resection (x4). Grade of carcinoma: High grade. Necrosis: Present. Estimated tumor size: (glass slide measurement): 0.6 cm Distance to closest margin: Greater than 0.2 cm to all margins. Breast prognostic profile: Case [id number]. Estrogen receptor: 95%, strong. Progesterone receptor: 90%, strong. Lymph nodes: Examined: 4 Sentinel 0 Non-sentinel 4 Total Lymph nodes with metastasis: 0 1 of 3 FINAL for Amy [name], Amy [name] ([id number]) Microscopic Comment(continued) TNM: pTis, pN0 Comments: In addition to the above, there are vascular calcifications, fibrocystic changes with adenosis and calcifications, and [name] healing biopsy site. (JBK:gt, 05/12/17)  Surgical pathology 04/16/2017 Diagnosis Breast, right, needle core biopsy, medial superior - HIGH GRADE DUCTAL CARCINOMA IN SITU WITH CALCIFICATIONS AND NECROSIS. - SEE MICROSCOPIC DESCRIPTION Microscopic Comment There is [name] microscopic focus suspicious for early invasive carcinoma. Estrogen and progesterone receptor studies will 1 of 2 FINAL for Amy [name], Amy [name] [phone]) Microscopic Comment(continued) be performed.  RADIOGRAPHIC STUDIES: I have personally reviewed the radiological images as listed and agreed with the findings in the report. No results found.  ASSESSMENT & PLAN:  71 year old post menopausal Caucasian woman  1.Ductal carcinoma in situ (DCIS) of right breast, high grade, ER+/PR+ -I discussed her breast imaging and surgical path findings with patient in great detail. -She has had [name] complete surgical resection with negative margins. -Her DCIS has been cured by complete surgical resection. Any form of adjuvant therapy is preventive. -She is status post mastectomy, no need radiation. -Given her strongly positive/negative ER and PR and high grade DCIS, I do recommend antiestrogen therapy with Tamoxifen or Anastrozole, which decrease her risk of future breast cancer by ~40%. We discussed there  is no survival benefit from these chemoprevention. -The potential benefit and side effects, which includes but not limited to, hot flash, skin and vaginal dryness, metabolic changes ( increased blood glucose, cholesterol, weight, etc.), slightly in increased risk of cardiovascular disease, cataracts, muscular and joint discomfort, osteopenia and osteoporosis, increased risk of thrombosis and endometrial cancer from tamoxifen, etc, were discussed with her in great details.  -She is reluctant to consider chemotherapy prevention, due to the concern of side effects. She prefers quality of life. She did have significant mood swings when she had menopause. --We discussed breast cancer surveillance after she completes treatment, Including annual mammogram, breast exam every 6-12 months. - she will f/u with Dr. Marlou Starks, [place], and PCP -I'll see her as needed in the future.  2.history of skin melanoma, resected  3. Arthritis 4. Glaucoma   PLAN  -she decided not to  take antiestrogen therapy to prevent breast cancer -She'll continue follow-up with Dr. Marlou Starks, and her gynecologist for breast exam -She will continue annual mammogram -I'll see her as needed in the future  All questions were answered. The patient knows to call the clinic with any problems, questions or concerns. I spent 55 minutes counseling the patient face to face. The total time spent in the appointment was 60 minutes and more than 50% was on counseling.  This document serves as [name] record of services personally performed by Truitt Merle, MD. It was created on her behalf by Brandt Loosen, [name] trained medical scribe. The creation of this record is based on the scribe's personal observations and the provider's statements to them. This document has been checked and approved by the attending provider.     Truitt Merle, MD 05/22/2017

## 2017-05-22 ENCOUNTER — Ambulatory Visit (HOSPITAL_BASED_OUTPATIENT_CLINIC_OR_DEPARTMENT_OTHER): Payer: Medicare Other | Admitting: Hematology

## 2017-05-22 ENCOUNTER — Encounter: Payer: Self-pay | Admitting: Hematology

## 2017-05-22 ENCOUNTER — Telehealth: Payer: Self-pay | Admitting: Hematology

## 2017-05-22 VITALS — BP 149/63 | HR 74 | Temp 98.2°F | Resp 20 | Ht 68.0 in | Wt 194.8 lb

## 2017-05-22 DIAGNOSIS — Z170 Estrogen receptor positive status [ER+]: Secondary | ICD-10-CM | POA: Diagnosis not present

## 2017-05-22 DIAGNOSIS — D0511 Intraductal carcinoma in situ of right breast: Secondary | ICD-10-CM

## 2017-05-22 NOTE — Telephone Encounter (Signed)
Per 6/1 - NO LOS

## 2017-05-23 ENCOUNTER — Encounter: Payer: Self-pay | Admitting: Hematology

## 2017-05-25 ENCOUNTER — Telehealth: Payer: Self-pay | Admitting: *Deleted

## 2017-05-25 NOTE — Addendum Note (Signed)
Addendum  created 05/25/17 1531 by Kanai Berrios, MD   Sign clinical note    

## 2017-05-25 NOTE — Telephone Encounter (Signed)
Error

## 2017-07-08 DIAGNOSIS — M25561 Pain in right knee: Secondary | ICD-10-CM | POA: Diagnosis not present

## 2017-07-08 DIAGNOSIS — M25562 Pain in left knee: Secondary | ICD-10-CM | POA: Diagnosis not present

## 2017-07-09 DIAGNOSIS — H401131 Primary open-angle glaucoma, bilateral, mild stage: Secondary | ICD-10-CM | POA: Diagnosis not present

## 2017-07-10 ENCOUNTER — Ambulatory Visit: Payer: Medicare Other | Attending: General Surgery | Admitting: Physical Therapy

## 2017-07-10 DIAGNOSIS — M6281 Muscle weakness (generalized): Secondary | ICD-10-CM | POA: Diagnosis not present

## 2017-07-10 DIAGNOSIS — Z483 Aftercare following surgery for neoplasm: Secondary | ICD-10-CM | POA: Diagnosis not present

## 2017-07-10 DIAGNOSIS — R293 Abnormal posture: Secondary | ICD-10-CM | POA: Diagnosis not present

## 2017-07-10 NOTE — Therapy (Signed)
[place], [place], [address] Phone: [phone]   Fax:  [phone]  Physical Therapy Evaluation  Patient Details  Name: Amy [name] MRN: [id number] Date of Birth: February 26, 1946 Referring Provider: Dr. Marlou Starks   Encounter Date: 07/10/2017      PT End of Session - 07/10/17 1255    Visit Number 1   Number of Visits 9   Date for PT Re-Evaluation 08/10/17   PT Start Time 0855   PT Stop Time 0935   PT Time Calculation (min) 40 min   Activity Tolerance Patient tolerated treatment well   Behavior During Therapy [facility] for tasks assessed/performed      Past Medical History:  Diagnosis Date  . Arthritis   . Hypertension   . Skin cancer     Past Surgical History:  Procedure Laterality Date  . ABDOMINAL HYSTERECTOMY    . FACIAL COSMETIC SURGERY     "eye lid surgery"  . KNEE ARTHROSCOPY Bilateral    torn meniscus on bilateral knees  . MASTECTOMY COMPLETE / SIMPLE W/ SENTINEL NODE BIOPSY Right 05/08/2017  . MASTECTOMY W/ SENTINEL NODE BIOPSY Right 05/08/2017   Procedure: RIGHT MASTECTOMY WITH SENTINEL LYMPH NODE BIOPSY;  Surgeon: Jovita Kussmaul, MD;  Location: [facility];  Service: General;  Laterality: Right;  . NOSE SURGERY     deviated septum   . titanium ear implant Left    "for hearing" left, Dr. Thornell Mule    There were no vitals filed for this visit.       Subjective Assessment - 07/10/17 0906    Subjective "I'm doing great "    Pertinent History Pt was diagnosed with right breast cancer late April 2018 and underwent mastectomy with sentinel node biopsy on May 08, 2017. All Lymph nodes were clear. She will not have to chemo or radition .  she worked at the [employer] for 37 years and has has intermittent shoulder pain, but nothing needs to be treated    Patient Stated Goals just wants to make sure that I do the right things for my arm to get well so I can use it like normal    Currently in Pain?  No/denies            [facility] PT Assessment - 07/10/17 0001      Assessment   Medical Diagnosis right  breast cancer    Referring Provider Dr. Marlou Starks    Onset Date/Surgical Date 05/08/17   Hand Dominance Right     Precautions   Precautions None     Restrictions   Weight Bearing Restrictions No     Balance Screen   Has the patient fallen in the past 6 months No   Has the patient had a decrease in activity level because of a fear of falling?  No   Is the patient reluctant to leave their home because of a fear of falling?  No     Home Environment   Living Environment Private residence   Living Arrangements Spouse/significant other   Available Help at Discharge Available 24 hours/day     Prior Function   Level of [place] Retired   [place] so she is [place] busy, she does do any heavy lifting with her arm.  She thinks she will stay active with her activites instead of structured exercise      Cognition   Overall Cognitive Status Within Functional Limits  for tasks assessed     Observation/Other Assessments   Observations well healed incision in right chest.  wearing a [place] post mastectomy bra with racer back style. note that she has fullness at anterior and posterior axilla above bra edge.   Recommended Mardene Celeste style for more back coverage and for pt to adjust bra over anterior axilla as she can    Skin Integrity no open areas    Quick DASH  25     Sensation   Light Touch Not tested     Coordination   Gross Motor Movements are Fluid and Coordinated Yes     Posture/Postural Control   Posture/Postural Control Postural limitations   Postural Limitations Forward head;Increased thoracic kyphosis   Posture Comments scoliosis"I'm shaped just like my mom" pt has had this a long while with no difficulty      AROM   Right Shoulder Flexion 140 Degrees   Right Shoulder ABduction 130 Degrees   Right Shoulder External Rotation 80  Degrees   Left Shoulder Flexion 140 Degrees   Left Shoulder ABduction 130 Degrees   Left Shoulder External Rotation 80 Degrees     Strength   Overall Strength Deficits   Overall Strength Comments pt with generalized weakness of both shoulders. difficulty lifting a 2 # weight 5 times.  No scapular winging noted during this test      Palpation   Palpation comment Fullness palpated in right upper arm above elbow as compared to left arm which feels "looser"  Pt was able to perceive this difference herself            LYMPHEDEMA/ONCOLOGY QUESTIONNAIRE - 07/10/17 0928      Right Upper Extremity Lymphedema   10 cm Proximal to Olecranon Process 34.2 cm   Olecranon Process 27.5 cm   15 cm Proximal to Ulnar Styloid Process 25 cm   Just Proximal to Ulnar Styloid Process 16 cm   Across Hand at [employer] 18.5 cm   At [place] of 2nd Digit 6 cm     Left Upper Extremity Lymphedema   10 cm Proximal to Olecranon Process 34 cm   Olecranon Process 27.5 cm   15 cm Proximal to Ulnar Styloid Process 25 cm   Just Proximal to Ulnar Styloid Process 16 cm   Across Hand at [employer] 18.5 cm   At [place] of 2nd Digit 6 cm           Quick Dash - 07/10/17 0001    Open a tight or new jar Mild difficulty   Do heavy household chores (wash walls, wash floors) Moderate difficulty   Carry a shopping bag or briefcase Mild difficulty   Wash your back Moderate difficulty   Use a knife to cut food No difficulty   Recreational activities in which you take some force or impact through your arm, shoulder, or hand (golf, hammering, tennis) Moderate difficulty   During the past week, to what extent has your arm, shoulder or hand problem interfered with your normal social activities with family, friends, neighbors, or groups? Not at all   During the past week, to what extent has your arm, shoulder or hand problem limited your work or other regular daily activities Slightly   Arm, shoulder, or hand pain. Mild    Tingling (pins and needles) in your arm, shoulder, or hand None   Difficulty Sleeping Mild difficulty   DASH Score 25 %      Objective measurements completed on  examination: See above findings.                  PT Education - 07/10/17 1302    Education provided Yes   Education Details ABC class offering    Person(s) Educated Patient   Methods Explanation;Handout   Comprehension Verbalized understanding                [facility] Goals - 07/10/17 1304      CC Long Term Goal  #1   Title Pt will be knowledgeable in lymphedema risk reduction practices    Time 4   Period Weeks   Status New     CC Long Term Goal  #2   Title Pt will be independent in self manual lymph drainage for right upper extremity    Time 4   Period Weeks   Status New     CC Long Term Goal  #3   Title Pt will be independent in a home exercise program    Time 4   Period Weeks   Status New     CC Long Term Goal  #4   Title Pt will decrease Quick DASH score to < 19 indicating an improvement in function of right arm    Baseline 25   Time 4   Period Weeks   Status New             Plan - 07/10/17 1256    Clinical Impression Statement Pt is about 8 weeks post right mastectomy with sentinel node biopsy. She has postural deficits with decreased shoulder range of motion bilalerally at end range. She has generalized UE weakness and visible fullness in right anterior and posterior axilla.  Although her measurements are not remarkably differenct she does have palpable fullness in right upper arm above elbow This is a low complexity eval    History and Personal Factors relevant to plan of care: none   Clinical Presentation Stable   Clinical Presentation due to: no radiation or chemo treatment    Clinical Decision Making Low   Rehab Potential Good   Clinical Impairments Affecting Rehab Potential previous kyphosis and scoliosis    PT Frequency 2x / week   PT Duration 4 weeks    PT Treatment/Interventions ADLs/Self Care Home Management;Patient/family education;Taping;DME Instruction;Manual techniques;Manual lymph drainage;Passive range of motion;Therapeutic exercise   PT Next Visit Plan Perform and teach self manual lymph drainage to right upper arm. Teach Meeks decompression exercise, shoulder dowel and ROM ex  and progress to scapular strengthening and UE strenthening exercise.  Make sure pt is signed up for ABC class    Consulted and Agree with Plan of Care Patient      Patient will benefit from skilled therapeutic intervention in order to improve the following deficits and impairments:  Increased edema, Decreased knowledge of precautions, Decreased knowledge of use of DME, Decreased strength, Pain, Impaired UE functional use, Decreased range of motion, Postural dysfunction  Visit Diagnosis: Aftercare following surgery for neoplasm - Plan: PT plan of care cert/re-cert  Abnormal posture - Plan: PT plan of care cert/re-cert  Muscle weakness (generalized) - Plan: PT plan of care cert/re-cert      G-Codes - 59/93/57 1306    Functional Assessment Tool Used (Outpatient Only) Quick DASH    Functional Limitation Carrying, moving and handling objects   Carrying, Moving and Handling Objects Current Status (S1779) At least 20 percent but less than 40 percent impaired, limited or restricted   Carrying,  Moving and Handling Objects Goal Status [phone]) At least 1 percent but less than 20 percent impaired, limited or restricted       Problem List Patient Active Problem List   Diagnosis Date Noted  . Ductal carcinoma in situ (DCIS) of right breast 05/08/2017   Donato Heinz. Owens Shark PT  Norwood Levo 07/10/2017, 1:08 PM  [facility] [place], [place], [address] Phone: [phone]   Fax:  [phone]  Name: Amy [name] MRN: [id number] Date of Birth: 1946-02-18

## 2017-07-14 ENCOUNTER — Ambulatory Visit: Payer: Medicare Other | Admitting: Physical Therapy

## 2017-07-14 DIAGNOSIS — M6281 Muscle weakness (generalized): Secondary | ICD-10-CM | POA: Diagnosis not present

## 2017-07-14 DIAGNOSIS — R293 Abnormal posture: Secondary | ICD-10-CM

## 2017-07-14 DIAGNOSIS — Z483 Aftercare following surgery for neoplasm: Secondary | ICD-10-CM

## 2017-07-14 NOTE — Patient Instructions (Addendum)
1. Decompression Exercise     Cancer Rehab [phone]    Lie on back on firm surface, knees bent, feet flat, arms turned up, out to sides, backs of hands down. Time _5-15__ minutes. Surface: floor   2. Shoulder Press    Start in Decompression Exercise position. Press shoulders downward towards supporting surface. Hold __2-3__ seconds while counting out loud. Repeat _3-5___ times. Do _1-2___ times per day.   3. Head Press    Bring cervical spine (neck) into neutral position (by either tucking the chin towards the chest or tilting the chin upward). Feel weight on back of head. Press head downward into supporting surface.    Hold _2-3__ seconds. Repeat _3-5__ times. Do _1-2__ times per day.   4. Leg Lengthener    Straighten one leg. Pull toes AND forefoot toward knee, extend heel. Lengthen leg by pulling pelvis away from ribs. Hold _2-3__ seconds. Relax. Repeat __4-6__ times. Do other leg.  Surface: floor   5. Leg Press    Straighten one leg down to floor keeping leg aligned with hip. Pull toes AND forefoot toward knee; extend heel.  Press entire leg downward (as if pressing leg into sandy beach). DO NOT BEND KNEE. Hold _2-3__ seconds. Do __4-6__ times. Repeat with other leg.     Manual Lymph Drainage :  Hug yourself:  Hand over belly button.  Take 5 deep breaths : In slowly feeling your belly go up into your hands, return slowly,  Small circles over right groing, skin stretch from armpit to groin,  Small circles at left armpit, skin stretch across the chest.   Right hand on head,small  skin stretch from armpit , then middle arm, then elbow

## 2017-07-15 NOTE — Therapy (Addendum)
[place], [place], [address] Phone: [phone]   Fax:  [phone]  Physical Therapy Treatment  Patient Details  Name: Amy [name] MRN: [id number] Date of Birth: [birth year] Referring Provider: Dr. Marlou Starks   Encounter Date: 07/14/2017      PT End of Session - 07/15/17 0752    Visit Number 2   Number of Visits 9   Date for PT Re-Evaluation 08/10/17   PT Start Time 1102   PT Stop Time 1600   PT Time Calculation (min) 45 min   Activity Tolerance Patient tolerated treatment well   Behavior During Therapy [facility] for tasks assessed/performed      Past Medical History:  Diagnosis Date  . Arthritis   . Hypertension   . Skin cancer     Past Surgical History:  Procedure Laterality Date  . ABDOMINAL HYSTERECTOMY    . FACIAL COSMETIC SURGERY     "eye lid surgery"  . KNEE ARTHROSCOPY Bilateral    torn meniscus on bilateral knees  . MASTECTOMY COMPLETE / SIMPLE W/ SENTINEL NODE BIOPSY Right 05/08/2017  . MASTECTOMY W/ SENTINEL NODE BIOPSY Right 05/08/2017   Procedure: RIGHT MASTECTOMY WITH SENTINEL LYMPH NODE BIOPSY;  Surgeon: Jovita Kussmaul, MD;  Location: [facility];  Service: General;  Laterality: Right;  . NOSE SURGERY     deviated septum   . titanium ear implant Left    "for hearing" left, Dr. Thornell Mule    There were no vitals filed for this visit.      Subjective Assessment - 07/15/17 0747    Subjective Pt states she is doing well. She has been trying to pull her bra over her right anterior axilla, but finds that it keeps pulling to the left due to absence of breast on the right even with with a prosthesis .    Pertinent History Pt was diagnosed with right breast cancer late April 2018 and underwent mastectomy with sentinel node biopsy on May 08, 2017. All Lymph nodes were clear. She will not have to chemo or radition .  she worked at the [employer] for 37 years and has has intermittent shoulder  pain, but nothing needs to be treated    Patient Stated Goals just wants to make sure that I do the right things for my arm to get well so I can use it like normal    Currently in Pain? No/denies                         [facility] Adult PT Treatment/Exercise - 07/15/17 0001      Self-Care   Self-Care Other Self-Care Comments   Other Self-Care Comments  instructed in lymphedema risk reduction practices.      Shoulder Exercises: Supine   Flexion AAROM;Both;5 reps  with dowel    ABduction AAROM;Both;5 reps  with dowel    Other Supine Exercises Meeks decompression exercise      Manual Therapy   Manual Therapy Manual Lymphatic Drainage (MLD)   Manual Lymphatic Drainage (MLD) instructed and perfromed in supine:  short neck, superficial and deep abdominals, right inguinal nodes with right axillo-inguinal anastamosis, left axillary nodes with anterior interaxillary anastamosis, right shoulder collectors and right upper arm with hand over hand instruction for stroke depth and speed                 PT Education - 07/15/17 0752    Education provided Yes  Education Details Meeks decompression, supine dowel rod exercises, self manual lymph drainage    Person(s) Educated Patient   Methods Explanation;Handout   Comprehension Verbalized understanding;Returned demonstration                [facility] Goals - 07/10/17 1304      CC Long Term Goal  #1   Title Pt will be knowledgeable in lymphedema risk reduction practices    Time 4   Period Weeks   Status New     CC Long Term Goal  #2   Title Pt will be independent in self manual lymph drainage for right upper extremity    Time 4   Period Weeks   Status New     CC Long Term Goal  #3   Title Pt will be independent in a home exercise program    Time 4   Period Weeks   Status New     CC Long Term Goal  #4   Title Pt will decrease Quick DASH score to < 19 indicating an improvement in function of right  arm    Baseline 25   Time 4   Period Weeks   Status New            Plan - 07/15/17 0753    Clinical Impression Statement Pt appears to be improving and right upper arm is palpably less congested than last visit.  She was instructed in exercises and self manual lymph drainage to right upper arm should she feel congestion return.  She continues to need instruction in strengthening.  Anticipate discharge after one more visit.    Clinical Impairments Affecting Rehab Potential previous kyphosis and scoliosis    PT Duration 4 weeks   PT Next Visit Plan Review Meeks and dowel, teach supine scap series with progression. Review goals and discharge    Consulted and Agree with Plan of Care Patient      Patient will benefit from skilled therapeutic intervention in order to improve the following deficits and impairments:  Increased edema, Decreased knowledge of precautions, Decreased knowledge of use of DME, Decreased strength, Pain, Impaired UE functional use, Decreased range of motion, Postural dysfunction  Visit Diagnosis: Aftercare following surgery for neoplasm  Abnormal posture  Muscle weakness (generalized)     Problem List Patient Active Problem List   Diagnosis Date Noted  . Ductal carcinoma in situ (DCIS) of right breast 05/08/2017   Donato Heinz. Owens Shark, PT  Norwood Levo 07/15/2017, 7:56 AM  [place], [place], [address] Phone: [phone]   Fax:  [phone]  Name: Amy [name] MRN: [id number] Date of Birth: 01-02-1946  PHYSICAL THERAPY DISCHARGE SUMMARY  Visits from Start of Care: 2 Current functional level related to goals / functional outcomes: unknown   Remaining deficits: unknown   Education / Equipment: Home exercise , self manual lymph drainage  Plan: Patient agrees to discharge.  Patient goals were partially met. Patient is being discharged due to not returning  since the last visit.  ?????    Maudry Diego, PT 06/11/18 10:42 AM

## 2017-07-16 ENCOUNTER — Encounter: Payer: Medicare Other | Admitting: Physical Therapy

## 2017-07-22 ENCOUNTER — Encounter: Payer: Medicare Other | Admitting: Physical Therapy

## 2017-07-28 ENCOUNTER — Encounter: Payer: Medicare Other | Admitting: Physical Therapy

## 2017-07-30 ENCOUNTER — Encounter: Payer: Medicare Other | Admitting: Physical Therapy

## 2017-08-04 ENCOUNTER — Encounter: Payer: Medicare Other | Admitting: Physical Therapy

## 2017-08-06 ENCOUNTER — Encounter: Payer: Medicare Other | Admitting: Physical Therapy

## 2017-08-19 DIAGNOSIS — M1712 Unilateral primary osteoarthritis, left knee: Secondary | ICD-10-CM | POA: Diagnosis not present

## 2017-08-20 DIAGNOSIS — L418 Other parapsoriasis: Secondary | ICD-10-CM | POA: Diagnosis not present

## 2017-08-20 DIAGNOSIS — L570 Actinic keratosis: Secondary | ICD-10-CM | POA: Diagnosis not present

## 2017-08-20 DIAGNOSIS — D485 Neoplasm of uncertain behavior of skin: Secondary | ICD-10-CM | POA: Diagnosis not present

## 2017-08-27 ENCOUNTER — Encounter: Payer: Medicare Other | Admitting: Adult Health

## 2017-09-02 DIAGNOSIS — H903 Sensorineural hearing loss, bilateral: Secondary | ICD-10-CM | POA: Diagnosis not present

## 2017-09-02 DIAGNOSIS — H6123 Impacted cerumen, bilateral: Secondary | ICD-10-CM | POA: Diagnosis not present

## 2017-09-22 DIAGNOSIS — H40033 Anatomical narrow angle, bilateral: Secondary | ICD-10-CM | POA: Diagnosis not present

## 2017-09-22 DIAGNOSIS — H04123 Dry eye syndrome of bilateral lacrimal glands: Secondary | ICD-10-CM | POA: Diagnosis not present

## 2017-09-22 DIAGNOSIS — H402222 Chronic angle-closure glaucoma, left eye, moderate stage: Secondary | ICD-10-CM | POA: Diagnosis not present

## 2017-09-22 DIAGNOSIS — H2513 Age-related nuclear cataract, bilateral: Secondary | ICD-10-CM | POA: Diagnosis not present

## 2017-09-22 DIAGNOSIS — H16143 Punctate keratitis, bilateral: Secondary | ICD-10-CM | POA: Diagnosis not present

## 2017-09-22 DIAGNOSIS — H402213 Chronic angle-closure glaucoma, right eye, severe stage: Secondary | ICD-10-CM | POA: Diagnosis not present

## 2017-09-24 DIAGNOSIS — H40033 Anatomical narrow angle, bilateral: Secondary | ICD-10-CM | POA: Diagnosis not present

## 2017-10-02 DIAGNOSIS — H40033 Anatomical narrow angle, bilateral: Secondary | ICD-10-CM | POA: Diagnosis not present

## 2017-10-15 DIAGNOSIS — Z86008 Personal history of in-situ neoplasm of other site: Secondary | ICD-10-CM | POA: Diagnosis not present

## 2017-10-15 DIAGNOSIS — D2272 Melanocytic nevi of left lower limb, including hip: Secondary | ICD-10-CM | POA: Diagnosis not present

## 2017-10-15 DIAGNOSIS — Z86018 Personal history of other benign neoplasm: Secondary | ICD-10-CM | POA: Diagnosis not present

## 2017-10-15 DIAGNOSIS — L309 Dermatitis, unspecified: Secondary | ICD-10-CM | POA: Diagnosis not present

## 2017-10-16 DIAGNOSIS — H402213 Chronic angle-closure glaucoma, right eye, severe stage: Secondary | ICD-10-CM | POA: Diagnosis not present

## 2017-10-16 DIAGNOSIS — H402222 Chronic angle-closure glaucoma, left eye, moderate stage: Secondary | ICD-10-CM | POA: Diagnosis not present

## 2017-12-20 DIAGNOSIS — J209 Acute bronchitis, unspecified: Secondary | ICD-10-CM | POA: Diagnosis not present

## 2018-01-13 DIAGNOSIS — D0511 Intraductal carcinoma in situ of right breast: Secondary | ICD-10-CM | POA: Diagnosis not present

## 2018-01-14 DIAGNOSIS — H402213 Chronic angle-closure glaucoma, right eye, severe stage: Secondary | ICD-10-CM | POA: Diagnosis not present

## 2018-01-14 DIAGNOSIS — H402222 Chronic angle-closure glaucoma, left eye, moderate stage: Secondary | ICD-10-CM | POA: Diagnosis not present

## 2018-02-05 DIAGNOSIS — H9202 Otalgia, left ear: Secondary | ICD-10-CM | POA: Diagnosis not present

## 2018-02-16 DIAGNOSIS — H73002 Acute myringitis, left ear: Secondary | ICD-10-CM | POA: Diagnosis not present

## 2018-02-16 DIAGNOSIS — H903 Sensorineural hearing loss, bilateral: Secondary | ICD-10-CM | POA: Diagnosis not present

## 2018-02-16 DIAGNOSIS — H6123 Impacted cerumen, bilateral: Secondary | ICD-10-CM | POA: Diagnosis not present

## 2018-03-09 DIAGNOSIS — H6123 Impacted cerumen, bilateral: Secondary | ICD-10-CM | POA: Diagnosis not present

## 2018-03-09 DIAGNOSIS — H903 Sensorineural hearing loss, bilateral: Secondary | ICD-10-CM | POA: Diagnosis not present

## 2018-03-16 DIAGNOSIS — H402213 Chronic angle-closure glaucoma, right eye, severe stage: Secondary | ICD-10-CM | POA: Diagnosis not present

## 2018-03-16 DIAGNOSIS — H402221 Chronic angle-closure glaucoma, left eye, mild stage: Secondary | ICD-10-CM | POA: Diagnosis not present

## 2018-03-31 DIAGNOSIS — I83893 Varicose veins of bilateral lower extremities with other complications: Secondary | ICD-10-CM | POA: Diagnosis not present

## 2018-05-03 DIAGNOSIS — E785 Hyperlipidemia, unspecified: Secondary | ICD-10-CM | POA: Diagnosis not present

## 2018-05-03 DIAGNOSIS — E663 Overweight: Secondary | ICD-10-CM | POA: Diagnosis not present

## 2018-05-03 DIAGNOSIS — Z6829 Body mass index (BMI) 29.0-29.9, adult: Secondary | ICD-10-CM | POA: Diagnosis not present

## 2018-05-03 DIAGNOSIS — I10 Essential (primary) hypertension: Secondary | ICD-10-CM | POA: Diagnosis not present

## 2018-05-03 DIAGNOSIS — Z0000 Encounter for general adult medical examination without abnormal findings: Secondary | ICD-10-CM | POA: Diagnosis not present

## 2018-05-04 DIAGNOSIS — Z1231 Encounter for screening mammogram for malignant neoplasm of breast: Secondary | ICD-10-CM | POA: Diagnosis not present

## 2018-05-04 DIAGNOSIS — Z6827 Body mass index (BMI) 27.0-27.9, adult: Secondary | ICD-10-CM | POA: Diagnosis not present

## 2018-05-04 DIAGNOSIS — Z853 Personal history of malignant neoplasm of breast: Secondary | ICD-10-CM | POA: Diagnosis not present

## 2018-05-04 DIAGNOSIS — N959 Unspecified menopausal and perimenopausal disorder: Secondary | ICD-10-CM | POA: Diagnosis not present

## 2018-05-05 ENCOUNTER — Other Ambulatory Visit: Payer: Self-pay

## 2018-05-05 DIAGNOSIS — L819 Disorder of pigmentation, unspecified: Secondary | ICD-10-CM | POA: Diagnosis not present

## 2018-05-05 DIAGNOSIS — Z1211 Encounter for screening for malignant neoplasm of colon: Secondary | ICD-10-CM | POA: Diagnosis not present

## 2018-05-05 DIAGNOSIS — L570 Actinic keratosis: Secondary | ICD-10-CM | POA: Diagnosis not present

## 2018-05-05 DIAGNOSIS — M25551 Pain in right hip: Secondary | ICD-10-CM | POA: Diagnosis not present

## 2018-08-11 DIAGNOSIS — D0511 Intraductal carcinoma in situ of right breast: Secondary | ICD-10-CM | POA: Diagnosis not present

## 2018-09-01 DIAGNOSIS — Z6827 Body mass index (BMI) 27.0-27.9, adult: Secondary | ICD-10-CM | POA: Diagnosis not present

## 2018-09-01 DIAGNOSIS — W57XXXA Bitten or stung by nonvenomous insect and other nonvenomous arthropods, initial encounter: Secondary | ICD-10-CM | POA: Diagnosis not present

## 2018-09-01 DIAGNOSIS — B999 Unspecified infectious disease: Secondary | ICD-10-CM | POA: Diagnosis not present

## 2018-09-16 DIAGNOSIS — H402222 Chronic angle-closure glaucoma, left eye, moderate stage: Secondary | ICD-10-CM | POA: Diagnosis not present

## 2018-09-16 DIAGNOSIS — H402213 Chronic angle-closure glaucoma, right eye, severe stage: Secondary | ICD-10-CM | POA: Diagnosis not present

## 2018-09-16 DIAGNOSIS — H2513 Age-related nuclear cataract, bilateral: Secondary | ICD-10-CM | POA: Diagnosis not present

## 2018-09-17 DIAGNOSIS — H2513 Age-related nuclear cataract, bilateral: Secondary | ICD-10-CM | POA: Diagnosis not present

## 2018-09-17 DIAGNOSIS — H402213 Chronic angle-closure glaucoma, right eye, severe stage: Secondary | ICD-10-CM | POA: Diagnosis not present

## 2018-09-26 IMAGING — MG STEREOTACTIC VACUUM ASSIST RIGHT
8 of 9 series · 8 of 17 positions shown · non-contrast
Comparison: Previous exams.

ADDENDUM:
Pathology revealed HIGH GRADE DUCTAL CARCINOMA IN SITU WITH
CALCIFICATIONS AND NECROSIS of the Right breast, medial superior.
This was found to be concordant by Dr. Apple Tiger. Pathology
results were discussed with the patient by telephone. The patient
reported doing well after the biopsy with tenderness at the site.
Post biopsy instructions and care were reviewed and questions were
answered. The patient was encouraged to call The [REDACTED] of
consultation has been arranged with Dr. Malkit Ferrera at [REDACTED] on April 20, 2017.

Pathology results reported by Keydir Josue Gaitan Gaitan, RN on 04/20/2017.
CLINICAL DATA: Right breast calcifications
EXAM:
RIGHT BREAST STEREOTACTIC CORE NEEDLE BIOPSY

[R ML (1 of 7)]
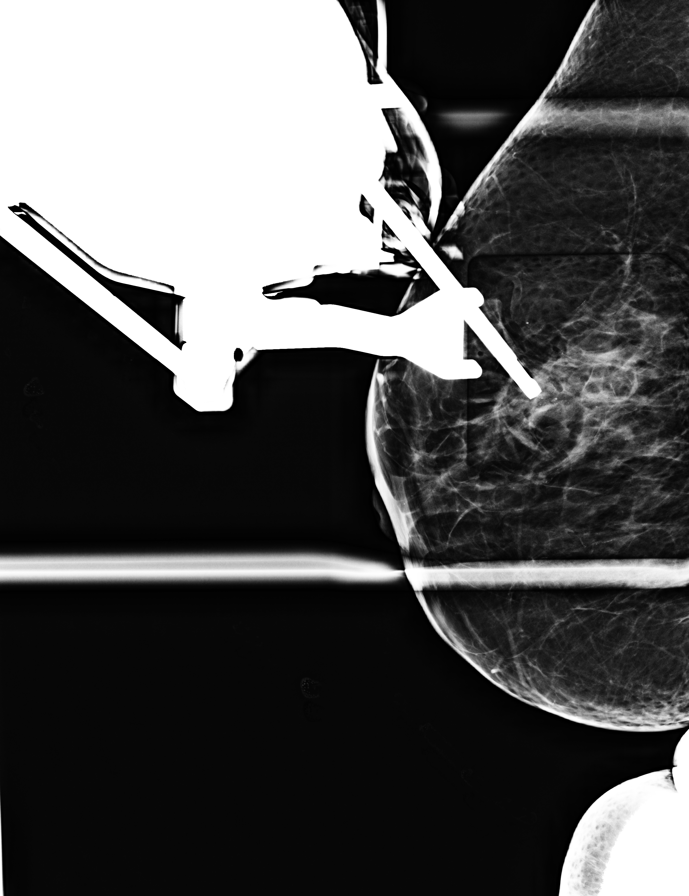

[R ML (2 of 7)]
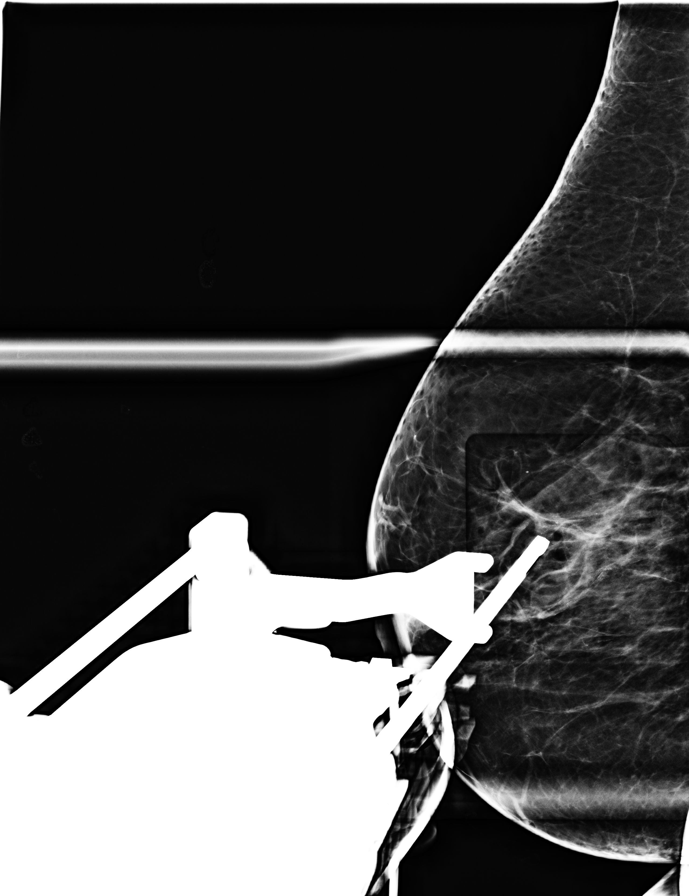

[R ML (3 of 7)]
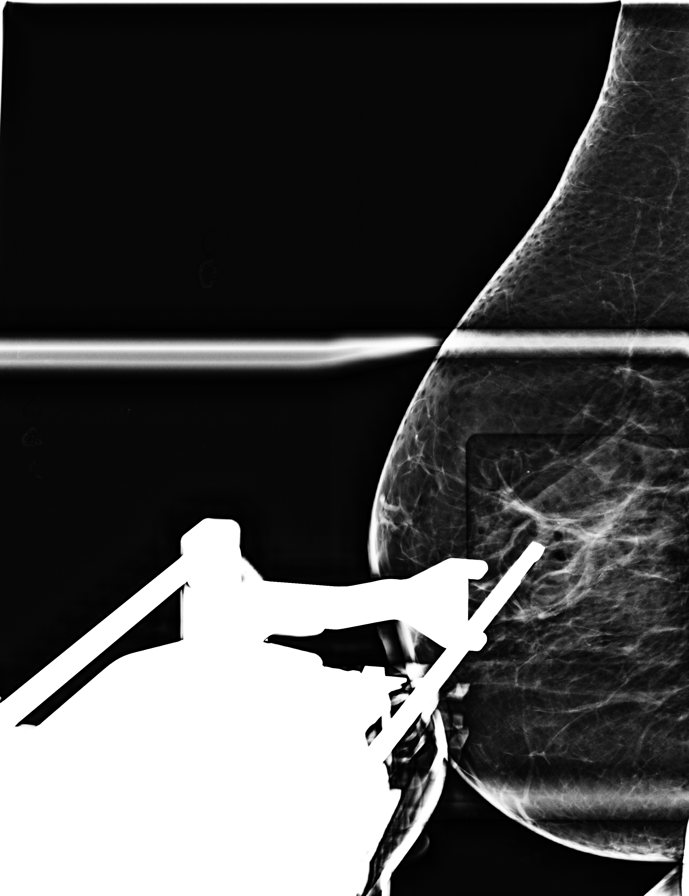

[R ML (4 of 7)]
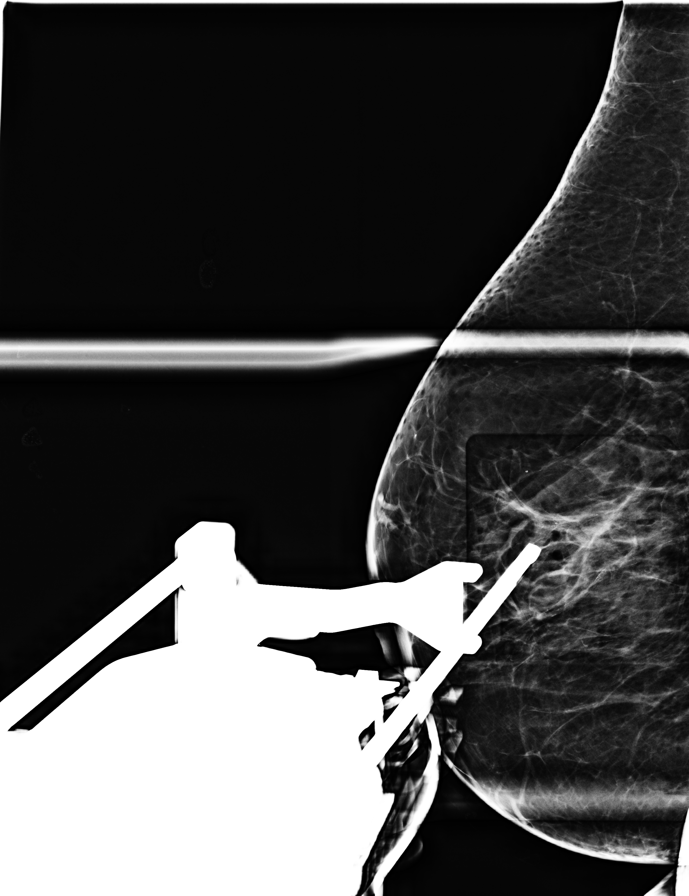

[R ML (5 of 7)]
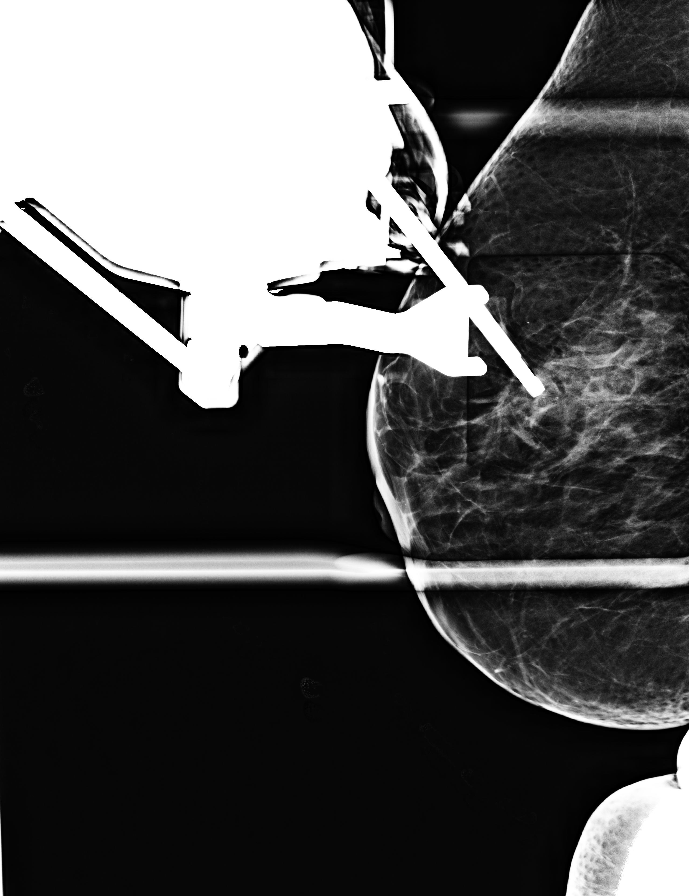

[R ML (6 of 7)]
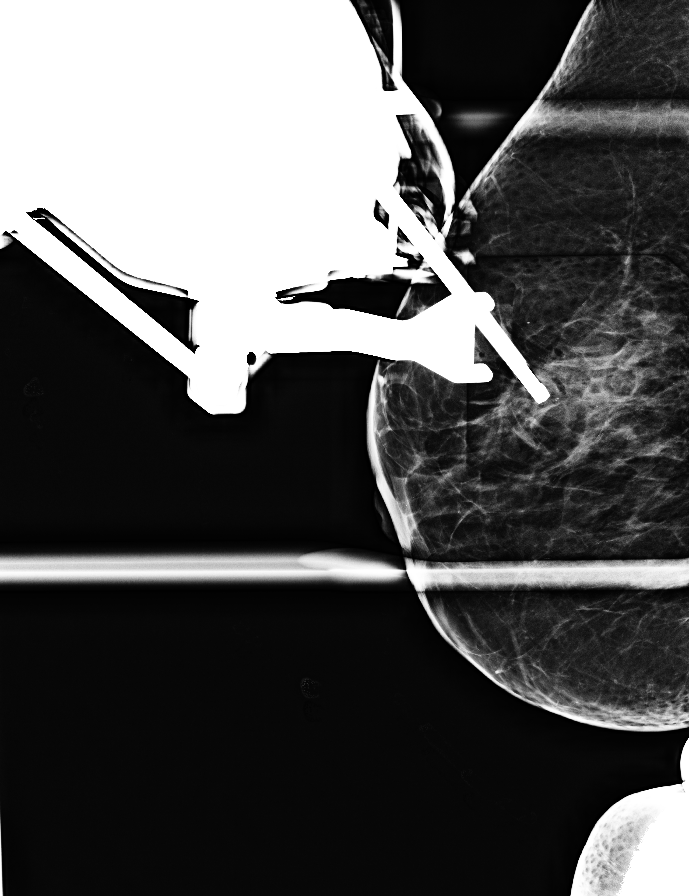

[R ML (7 of 7)]
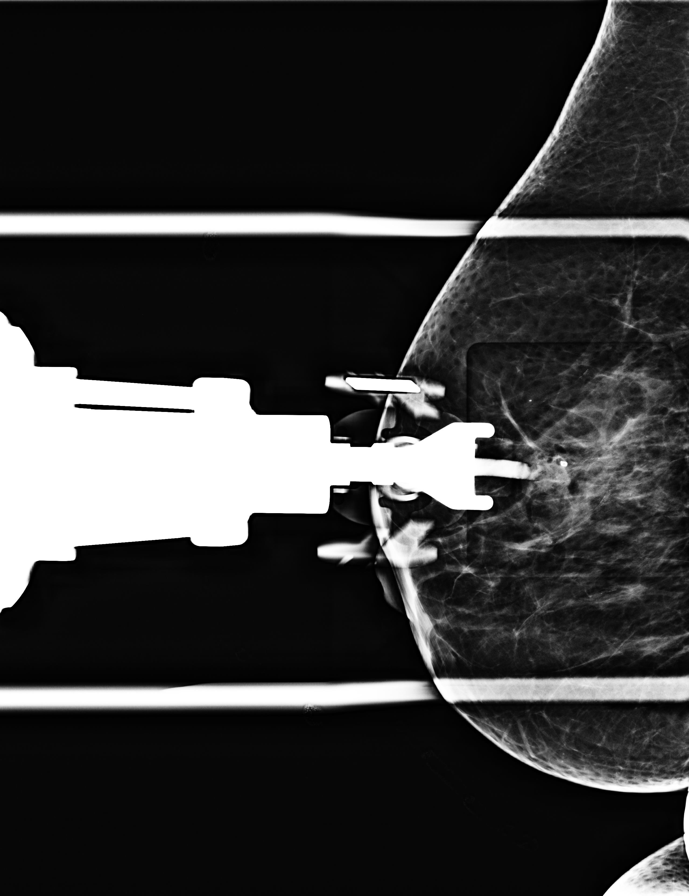

[R ML tomo · tomo slice 29/57.0]
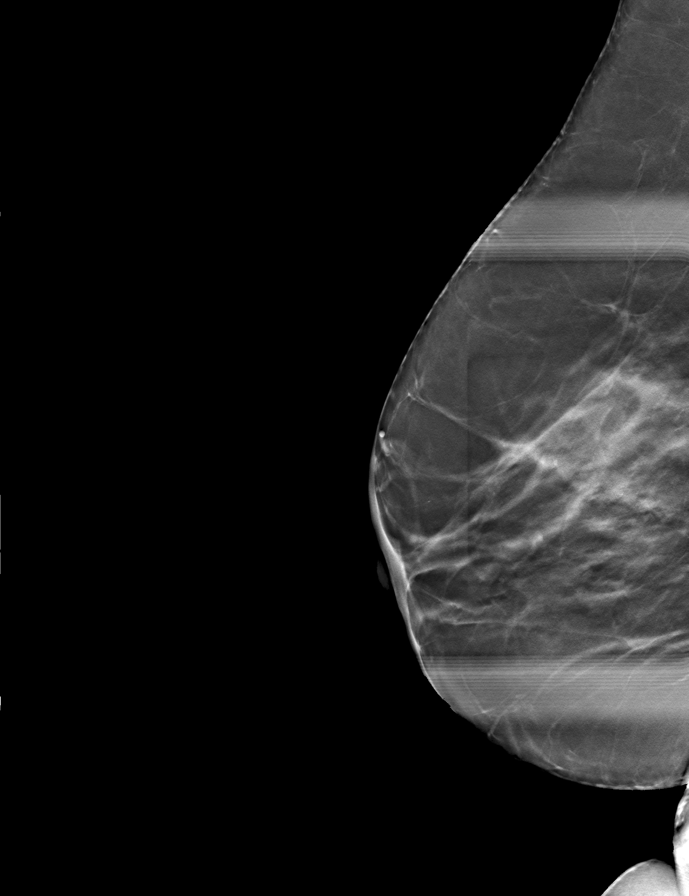

[8 of 17 positions shown; findings below may reference images not displayed]



Using sterile technique and 1% Lidocaine as local anesthetic, under
stereotactic guidance, a 9 gauge vacuum assisted device was used to
perform core needle biopsy of calcifications in the medial right
breast using a medial approach. Specimen radiograph was performed
showing calcifications in 2 core specimens. Specimens with
calcifications are identified for pathology.

Lesion quadrant: Medial superior

At the conclusion of the procedure, a coil shaped tissue marker clip
was deployed into the biopsy cavity. Follow-up 2-view mammogram was
performed and dictated separately.
IMPRESSION: Stereotactic-guided biopsy of right breast calcifications. No
apparent complications.

## 2018-09-30 DIAGNOSIS — H2511 Age-related nuclear cataract, right eye: Secondary | ICD-10-CM | POA: Diagnosis not present

## 2018-09-30 DIAGNOSIS — H401113 Primary open-angle glaucoma, right eye, severe stage: Secondary | ICD-10-CM | POA: Diagnosis not present

## 2018-10-07 DIAGNOSIS — D2272 Melanocytic nevi of left lower limb, including hip: Secondary | ICD-10-CM | POA: Diagnosis not present

## 2018-10-07 DIAGNOSIS — Z86006 Personal history of melanoma in-situ: Secondary | ICD-10-CM | POA: Diagnosis not present

## 2018-10-07 DIAGNOSIS — Z85828 Personal history of other malignant neoplasm of skin: Secondary | ICD-10-CM | POA: Diagnosis not present

## 2018-10-07 DIAGNOSIS — Z23 Encounter for immunization: Secondary | ICD-10-CM | POA: Diagnosis not present

## 2018-10-07 DIAGNOSIS — L821 Other seborrheic keratosis: Secondary | ICD-10-CM | POA: Diagnosis not present

## 2018-10-07 DIAGNOSIS — Z86018 Personal history of other benign neoplasm: Secondary | ICD-10-CM | POA: Diagnosis not present

## 2018-10-07 DIAGNOSIS — L570 Actinic keratosis: Secondary | ICD-10-CM | POA: Diagnosis not present

## 2018-10-07 DIAGNOSIS — Z808 Family history of malignant neoplasm of other organs or systems: Secondary | ICD-10-CM | POA: Diagnosis not present

## 2018-10-14 DIAGNOSIS — H6123 Impacted cerumen, bilateral: Secondary | ICD-10-CM | POA: Diagnosis not present

## 2018-10-14 DIAGNOSIS — H906 Mixed conductive and sensorineural hearing loss, bilateral: Secondary | ICD-10-CM | POA: Diagnosis not present

## 2018-10-18 DIAGNOSIS — H2512 Age-related nuclear cataract, left eye: Secondary | ICD-10-CM | POA: Diagnosis not present

## 2018-10-21 DIAGNOSIS — H401122 Primary open-angle glaucoma, left eye, moderate stage: Secondary | ICD-10-CM | POA: Diagnosis not present

## 2018-10-21 DIAGNOSIS — H2512 Age-related nuclear cataract, left eye: Secondary | ICD-10-CM | POA: Diagnosis not present

## 2019-04-11 ENCOUNTER — Encounter: Payer: Self-pay | Admitting: Podiatry

## 2019-04-11 ENCOUNTER — Ambulatory Visit (INDEPENDENT_AMBULATORY_CARE_PROVIDER_SITE_OTHER): Payer: Medicare Other | Admitting: Podiatry

## 2019-04-11 ENCOUNTER — Ambulatory Visit (INDEPENDENT_AMBULATORY_CARE_PROVIDER_SITE_OTHER): Payer: Medicare Other

## 2019-04-11 ENCOUNTER — Other Ambulatory Visit: Payer: Self-pay

## 2019-04-11 DIAGNOSIS — M779 Enthesopathy, unspecified: Secondary | ICD-10-CM | POA: Diagnosis not present

## 2019-04-11 DIAGNOSIS — M2041 Other hammer toe(s) (acquired), right foot: Secondary | ICD-10-CM

## 2019-04-11 MED ORDER — TRIAMCINOLONE ACETONIDE 10 MG/ML IJ SUSP
10.0000 mg | Freq: Once | INTRAMUSCULAR | Status: AC
  Administered 2019-04-11: 10 mg

## 2019-04-11 NOTE — Progress Notes (Signed)
Subjective:   Patient ID: Amy [name], female   DOB: 73 y.o.   MRN: [id number]   HPI Patient presents stating that she has had a lot of pain in her right foot and is been going on for around a month that she does not remember injury.  States is been gradually becoming more aggravating and more difficult for her to be comfortable with and that she has tried shoe gear modifications without relief of symptoms.  Patient does not smoke likes to be active   Review of Systems  All other systems reviewed and are negative.       Objective:  Physical Exam Vitals signs and nursing note reviewed.  Constitutional:      Appearance: She is well-developed.  Pulmonary:     Effort: Pulmonary effort is normal.  Musculoskeletal: Normal range of motion.  Skin:    General: Skin is warm.  Neurological:     Mental Status: She is alert.     Neurovascular status found to be intact muscle strength is adequate range of motion within normal limits with patient noted to have exquisite discomfort second MPJ right with inflammation fluid around the joint surface and no indication so far proximal pain or other condition.  Patient is found to have good digital perfusion well oriented x3     Assessment:  Acute capsulitis second MPJ right foot with slight possibility for stress fracture or other issue     Plan:  H&P x-ray reviewed today I did a proximal nerve block aspirated the joint getting out a small amount of clear fluid and injected quarter cc dexamethasone Kenalog and applied pad to reduce stress against the joint.  Advised on rigid bottom shoes and reappoint to recheck  X-rays indicate that there is no signs of stress fracture or arthritic process with mild lateral deviation second digit

## 2019-04-25 ENCOUNTER — Ambulatory Visit (INDEPENDENT_AMBULATORY_CARE_PROVIDER_SITE_OTHER): Payer: Medicare Other | Admitting: Podiatry

## 2019-04-25 ENCOUNTER — Encounter: Payer: Self-pay | Admitting: Podiatry

## 2019-04-25 ENCOUNTER — Other Ambulatory Visit: Payer: Self-pay

## 2019-04-25 VITALS — Temp 98.1°F

## 2019-04-25 DIAGNOSIS — IMO0002 Reserved for concepts with insufficient information to code with codable children: Secondary | ICD-10-CM | POA: Insufficient documentation

## 2019-04-25 DIAGNOSIS — M2041 Other hammer toe(s) (acquired), right foot: Secondary | ICD-10-CM

## 2019-04-25 DIAGNOSIS — M779 Enthesopathy, unspecified: Secondary | ICD-10-CM | POA: Diagnosis not present

## 2019-04-25 DIAGNOSIS — E663 Overweight: Secondary | ICD-10-CM

## 2019-04-25 DIAGNOSIS — N951 Menopausal and female climacteric states: Secondary | ICD-10-CM | POA: Insufficient documentation

## 2019-04-25 NOTE — Progress Notes (Signed)
Subjective:   Patient ID: Amy [name], female   DOB: 73 y.o.   MRN: [id number]   HPI Patient presents stating that the right second toe has still been bothering her but it is improved from previous.  States it is been gradually more of an issue for her if she is on it too much but very much better than it was when I started with her   ROS      Objective:  Physical Exam  Neurovascular status intact with subsecond metatarsal phalangeal joint right improved with pain still present upon deep palpation.  Patient has been wearing relatively rigid bottom shoes     Assessment:  Acute capsulitis right improving but still painful upon deep palpation     Plan:  H&P condition reviewed and at this point I have recommended continued rigid bottom shoes anti-inflammatories physical therapy for condition.  Patient will be seen back for [place] to recheck again in the next 4 to 6 weeks or earlier if necessary

## 2019-08-23 ENCOUNTER — Other Ambulatory Visit (HOSPITAL_BASED_OUTPATIENT_CLINIC_OR_DEPARTMENT_OTHER): Payer: Self-pay | Admitting: Anesthesiology

## 2019-08-23 ENCOUNTER — Other Ambulatory Visit (HOSPITAL_BASED_OUTPATIENT_CLINIC_OR_DEPARTMENT_OTHER): Payer: Self-pay | Admitting: Physician Assistant

## 2019-08-23 DIAGNOSIS — N289 Disorder of kidney and ureter, unspecified: Secondary | ICD-10-CM

## 2019-08-25 ENCOUNTER — Other Ambulatory Visit (HOSPITAL_BASED_OUTPATIENT_CLINIC_OR_DEPARTMENT_OTHER): Payer: Self-pay

## 2019-08-25 ENCOUNTER — Ambulatory Visit (HOSPITAL_BASED_OUTPATIENT_CLINIC_OR_DEPARTMENT_OTHER)
Admission: RE | Admit: 2019-08-25 | Discharge: 2019-08-25 | Disposition: A | Discharge status: Home / Self Care | Payer: Medicare Other | Source: Ambulatory Visit | Attending: Physician Assistant | Admitting: Physician Assistant

## 2019-08-25 ENCOUNTER — Other Ambulatory Visit: Payer: Self-pay

## 2019-08-25 DIAGNOSIS — R93422 Abnormal radiologic findings on diagnostic imaging of left kidney: Secondary | ICD-10-CM | POA: Diagnosis not present

## 2019-08-25 DIAGNOSIS — N289 Disorder of kidney and ureter, unspecified: Secondary | ICD-10-CM | POA: Diagnosis not present

## 2019-10-10 DIAGNOSIS — E7800 Pure hypercholesterolemia, unspecified: Secondary | ICD-10-CM

## 2019-10-10 DIAGNOSIS — N183 Chronic kidney disease, stage 3 unspecified: Secondary | ICD-10-CM | POA: Insufficient documentation

## 2019-10-10 DIAGNOSIS — I10 Essential (primary) hypertension: Secondary | ICD-10-CM | POA: Insufficient documentation

## 2019-10-10 HISTORY — DX: Pure hypercholesterolemia, unspecified: E78.00

## 2019-10-10 HISTORY — DX: Chronic kidney disease, stage 3 unspecified: N18.30

## 2020-03-09 DIAGNOSIS — H90A22 Sensorineural hearing loss, unilateral, left ear, with restricted hearing on the contralateral side: Secondary | ICD-10-CM | POA: Insufficient documentation

## 2020-03-09 DIAGNOSIS — H906 Mixed conductive and sensorineural hearing loss, bilateral: Secondary | ICD-10-CM | POA: Insufficient documentation

## 2020-03-09 DIAGNOSIS — Z9889 Other specified postprocedural states: Secondary | ICD-10-CM

## 2020-03-09 HISTORY — DX: Other specified postprocedural states: Z98.890

## 2020-08-09 DIAGNOSIS — H409 Unspecified glaucoma: Secondary | ICD-10-CM | POA: Insufficient documentation

## 2020-10-11 DIAGNOSIS — G629 Polyneuropathy, unspecified: Secondary | ICD-10-CM | POA: Insufficient documentation

## 2020-10-11 DIAGNOSIS — M109 Gout, unspecified: Secondary | ICD-10-CM | POA: Insufficient documentation

## 2020-10-11 HISTORY — DX: Polyneuropathy, unspecified: G62.9

## 2021-01-01 DIAGNOSIS — K635 Polyp of colon: Secondary | ICD-10-CM | POA: Diagnosis not present

## 2021-01-01 DIAGNOSIS — D120 Benign neoplasm of cecum: Secondary | ICD-10-CM | POA: Diagnosis not present

## 2021-01-01 DIAGNOSIS — Z1211 Encounter for screening for malignant neoplasm of colon: Secondary | ICD-10-CM | POA: Diagnosis not present

## 2021-01-01 DIAGNOSIS — K5730 Diverticulosis of large intestine without perforation or abscess without bleeding: Secondary | ICD-10-CM | POA: Diagnosis not present

## 2021-01-01 LAB — HM COLONOSCOPY

## 2021-01-08 DIAGNOSIS — U071 COVID-19: Secondary | ICD-10-CM | POA: Diagnosis not present

## 2021-01-15 ENCOUNTER — Emergency Department (INDEPENDENT_AMBULATORY_CARE_PROVIDER_SITE_OTHER)
Admission: EM | Admit: 2021-01-15 | Discharge: 2021-01-15 | Disposition: A | Discharge status: Home / Self Care | Payer: Medicare Other | Source: Home / Self Care

## 2021-01-15 ENCOUNTER — Telehealth: Payer: Self-pay

## 2021-01-15 ENCOUNTER — Emergency Department (INDEPENDENT_AMBULATORY_CARE_PROVIDER_SITE_OTHER): Payer: Medicare Other

## 2021-01-15 DIAGNOSIS — R110 Nausea: Secondary | ICD-10-CM

## 2021-01-15 DIAGNOSIS — J40 Bronchitis, not specified as acute or chronic: Secondary | ICD-10-CM | POA: Diagnosis not present

## 2021-01-15 DIAGNOSIS — U071 COVID-19: Secondary | ICD-10-CM

## 2021-01-15 DIAGNOSIS — R059 Cough, unspecified: Secondary | ICD-10-CM | POA: Diagnosis not present

## 2021-01-15 DIAGNOSIS — R0602 Shortness of breath: Secondary | ICD-10-CM

## 2021-01-15 MED ORDER — IPRATROPIUM-ALBUTEROL 0.5-2.5 (3) MG/3ML IN SOLN: 3.0000 mL | Freq: Four times a day (QID) | RESPIRATORY_TRACT | 0 refills | Status: DC | PRN

## 2021-01-15 MED ORDER — DOXYCYCLINE MONOHYDRATE 100 MG PO CAPS: 100.0000 mg | ORAL_CAPSULE | Freq: Two times a day (BID) | ORAL | 0 refills | Status: AC

## 2021-01-15 MED ORDER — DEXAMETHASONE SODIUM PHOSPHATE 10 MG/ML IJ SOLN
10.0000 mg | Freq: Once | INTRAMUSCULAR | Status: AC
  Administered 2021-01-15: 10 mg via INTRAMUSCULAR

## 2021-01-15 MED ORDER — ONDANSETRON 4 MG PO TBDP
4.0000 mg | ORAL_TABLET | Freq: Once | ORAL | Status: AC
  Administered 2021-01-15: 4 mg via ORAL

## 2021-01-15 MED ORDER — ONDANSETRON 4 MG PO TBDP: 4.0000 mg | ORAL_TABLET | Freq: Three times a day (TID) | ORAL | 0 refills | Status: DC | PRN

## 2021-01-15 NOTE — ED Notes (Signed)
Patient discharged with home nebulizer. Education given utilizing teach-back method. Patient ambulatory with steady gait for discharge.

## 2021-01-15 NOTE — ED Provider Notes (Signed)
[name]    CSN: [id number] Arrival date & time: 01/15/21  0954      History   Chief Complaint Chief Complaint  Patient presents with  . Cough  . Nausea    HPI Amy [name] is a 75 y.o. female.   HPI  Patient presents today for evaluation of worsening symptoms related to COVID-19 infection. Patient reports initially falling ill on 01/01/21 and received treatment for acute bronchitis via telemedicine provider. Symptoms continued to worsen, she subsequently took a home COVID test which was positive 01/04/21. Monitoring oxygen level at home and readings have been between 90-93%. She has no underlying chronic respiratory disease or known severe cardiovascular disease.  She is unvaccinated as she has been awaiting a repeat breast mammogram for follow-up of previous hx of breast cancer. She has had nausea which has attributed to poor oral intake. Received Zofran since arrival and has been able to tolerate ginger-ale. She has a cough which is wet and occasionally productive. Denies chest pain. Endorses weakness.  Past Medical History:  Diagnosis Date  . Arthritis   . Hypertension   . Skin cancer     Patient Active Problem List   Diagnosis Date Noted  . Body mass index (BMI) of 25.0 to 29.9 04/25/2019  . Menopausal symptom 04/25/2019  . Menopausal syndrome 04/25/2019  . Ductal carcinoma in situ (DCIS) of right breast 05/08/2017    Past Surgical History:  Procedure Laterality Date  . ABDOMINAL HYSTERECTOMY    . FACIAL COSMETIC SURGERY     "eye lid surgery"  . KNEE ARTHROSCOPY Bilateral    torn meniscus on bilateral knees  . MASTECTOMY COMPLETE / SIMPLE W/ SENTINEL NODE BIOPSY Right 05/08/2017  . MASTECTOMY W/ SENTINEL NODE BIOPSY Right 05/08/2017   Procedure: RIGHT MASTECTOMY WITH SENTINEL LYMPH NODE BIOPSY;  Surgeon: Jovita Kussmaul, MD;  Location: [facility];  Service: General;  Laterality: Right;  . NOSE SURGERY     deviated septum   . titanium ear implant Left     "for hearing" left, Dr. Thornell Mule    OB History   No obstetric history on file.      Home Medications    Prior to Admission medications   Medication Sig Start Date End Date Taking? Authorizing Provider  amoxicillin-clavulanate (AUGMENTIN) 875-125 MG tablet amoxicillin 875 mg-potassium clavulanate 125 mg tablet    [provider]  azithromycin (ZITHROMAX) 250 MG tablet azithromycin 250 mg tablet    [provider]  benzonatate (TESSALON) 200 MG capsule benzonatate 200 mg capsule    [provider]  bimatoprost (LUMIGAN) 0.01 % SOLN Lumigan 0.01 % eye drops  INSTILL 1 DROP INTO BOTH EYES AT BEDTIME    [provider]  brimonidine-timolol (COMBIGAN) 0.2-0.5 % ophthalmic solution Combigan 0.2 %-0.5 % eye drops    [provider]  cephALEXin (KEFLEX) 500 MG capsule cephalexin 500 mg capsule    [provider]  chlorpheniramine-HYDROcodone (TUSSIONEX) 10-8 MG/5ML SUER hydrocodone 10 mg-chlorpheniramine 8 mg/5 mL oral susp extend.rel 12hr    [provider]  ciprofloxacin-dexamethasone (CIPRODEX) OTIC suspension Ciprodex 0.3 %-0.1 % ear drops,suspension  INSTILL 3 DROPS IN LEFT EAR 3 TIMES/DAY X 1 WK    [provider]  clindamycin (CLEOCIN) 150 MG capsule clindamycin HCl 150 mg capsule    [provider]  colestipol (COLESTID) 1 g tablet colestipol 1 gram tablet    [provider]  diclofenac sodium (VOLTAREN) 1 % GEL diclofenac 1 % topical gel  [provider]  dorzolamide-timolol (COSOPT) 22.3-6.8 MG/ML ophthalmic solution dorzolamide 22.3 mg-timolol 6.8 mg/mL eye drops    [provider]  doxycycline (MONODOX) 100 MG capsule doxycycline monohydrate 100 mg capsule    [provider]  doxycycline (VIBRA-TABS) 100 MG tablet doxycycline hyclate 100 mg tablet    [provider]  estradiol (MINIVELLE) 0.075 MG/24HR Minivelle 0.075 mg/24 hr transdermal patch 11/17/15    [provider]  fluocinonide cream (LIDEX) 0.05 % fluocinonide 0.05 % topical cream    [provider]  fluticasone (FLONASE) 50 MCG/ACT nasal spray fluticasone propionate 50 mcg/actuation nasal spray,suspension    [provider]  FML FORTE 0.25 % ophthalmic suspension INSTILL 1 DROP INTO LEFT EYE AS DIRECTED 12/20/18   [provider]  hydrochlorothiazide (HYDRODIURIL) 25 MG tablet Take 25 mg by mouth daily. 04/01/19   [provider]  HYDROcodone-acetaminophen (NORCO/VICODIN) 5-325 MG tablet hydrocodone 5 mg-acetaminophen 325 mg tablet    [provider]  ketorolac (ACULAR) 0.5 % ophthalmic solution INSTILL 1 DROP INTO RIGHT EYE FOUR TIMES A DAY STARTING 1 DAY BEFORE SURGERY 11/24/18   [provider]  latanoprost (XALATAN) 0.005 % ophthalmic solution Place 1 drop into both eyes at bedtime.    [provider]  lisinopril-hydrochlorothiazide (ZESTORETIC) 20-25 MG tablet lisinopril 20 mg-hydrochlorothiazide 25 mg tablet    [provider]  losartan (COZAAR) 100 MG tablet Take 100 mg by mouth daily. 04/04/19   [provider]  losartan-hydrochlorothiazide (HYZAAR) 100-25 MG tablet Take 1 tablet by mouth daily before breakfast. 11/02/16   [provider]  metroNIDAZOLE (FLAGYL) 500 MG tablet metronidazole 500 mg tablet    [provider]  olopatadine (PATANOL) 0.1 % ophthalmic solution olopatadine 0.1 % eye drops    [provider]  oxyCODONE (OXY IR/ROXICODONE) 5 MG immediate release tablet Take 1 tablet (5 mg total) by mouth every 6 (six) hours as needed for moderate pain, severe pain or breakthrough pain. 05/09/17   Greer Pickerel, MD  prednisoLONE acetate (PRED FORTE) 1 % ophthalmic suspension prednisolone acetate 1 % eye drops,suspension    [provider]  ROCKLATAN 0.02-0.005 % SOLN Place 1 drop into both eyes at bedtime. 11/05/18   [provider]  rosuvastatin  (CRESTOR) 10 MG tablet TAKE 1 TABLET BY MOUTH ONE TIME PER WEEK 03/17/19   [provider]  timolol (BETIMOL) 0.5 % ophthalmic solution Place 1 drop into both eyes 2 (two) times daily.    [provider]  timolol (TIMOPTIC) 0.5 % ophthalmic solution Place 1 drop into both eyes 2 (two) times daily.    [provider]  triamcinolone cream (KENALOG) 0.1 % triamcinolone acetonide 0.1 % topical cream    [provider]    Family History Family History  Problem Relation Age of Onset  . Cancer Father 58       prostate cancer   . Cancer Sister 45       breast cancer   . Hypertension Mother     Social History Social History   Tobacco Use  . Smoking status: Never Smoker  . Smokeless tobacco: Never Used  Vaping Use  . Vaping Use: Never used  Substance Use Topics  . Alcohol use: Yes    Comment: occasionally a glass of wine   . Drug use: No     Allergies   Meperidine, Other, and Methylprednisolone   Review of Systems Review of Systems  Pertinent negatives listed in HPI  Physical Exam  Triage Vital Signs ED Triage Vitals  Enc Vitals Group     BP 01/15/21 1103 127/74     Pulse Rate 01/15/21 1103 92     Resp 01/15/21 1103 18     Temp 01/15/21 1103 99 F (37.2 C)     Temp Source 01/15/21 1103 Oral     SpO2 01/15/21 1103 96 %     Weight --      Height --      Head Circumference --      Peak Flow --      Pain Score 01/15/21 1059 2     Pain Loc --      Pain Edu? --      Excl. in [place]? --    No data found.  Updated Vital Signs BP 127/74 (BP Location: Left Arm)   Pulse 92   Temp 99 F (37.2 C) (Oral)   Resp 18   SpO2 96%   Visual Acuity Right Eye Distance:   Left Eye Distance:   Bilateral Distance:    Right Eye Near:   Left Eye Near:    Bilateral Near:     Physical Exam Constitutional:      Appearance: She is ill-appearing.  HENT:     Head: Normocephalic.     Right Ear: External ear normal.     Left Ear: External ear  normal.     Nose: Rhinorrhea present.     Mouth/Throat:     Mouth: Mucous membranes are moist.  Eyes:     General: No scleral icterus.    Extraocular Movements: Extraocular movements intact.     Pupils: Pupils are equal, round, and reactive to light.     Comments: Eye redness present bilaterally  Cardiovascular:     Rate and Rhythm: Normal rate.     Heart sounds: Normal heart sounds.  Pulmonary:     Effort: No respiratory distress.     Breath sounds: No stridor. Wheezing and rhonchi present. No rales.  Abdominal:     General: Abdomen is flat.  Musculoskeletal:     Cervical back: Normal range of motion.  Skin:    General: Skin is warm and dry.     Capillary Refill: Capillary refill takes less than 2 seconds.  Neurological:     General: No focal deficit present.     Mental Status: She is alert and oriented to person, place, and time. Mental status is at baseline.  Psychiatric:        Mood and Affect: Mood normal.      UC Treatments / Results  Labs (all labs ordered are listed, but only abnormal results are displayed) Labs Reviewed - No data to display  EKG   Radiology DG Chest 2 View  Result Date: 01/15/2021 CLINICAL DATA:  Cough.  COVID-19 positive EXAM: CHEST - 2 VIEW COMPARISON:  None. FINDINGS: Lungs are clear. Heart size and pulmonary vascularity are normal. No adenopathy. There is thoracic levoscoliosis. There is degenerative change in the thoracic spine. There are surgical clips in the lateral right breast region. Patient is status post mastectomy on the right. IMPRESSION: Status post right mastectomy. No edema or airspace opacity. Heart size normal. Electronically Signed   By: Lowella Grip III M.D.   On: 01/15/2021 11:29    Procedures Procedures (including critical care time)  Medications Ordered in UC Medications  ondansetron (ZOFRAN-ODT) disintegrating tablet 4 mg (4 mg Oral Given 01/15/21 1108)  dexamethasone (DECADRON) injection 10 mg (10 mg  Intramuscular Given 01/15/21 1227)    Initial Impression / Assessment and Plan / UC Course  I have reviewed the triage vital signs and the nursing notes.  Pertinent labs & imaging results that were available during my care of the patient were reviewed by me and considered in my medical decision making (see chart for details).   COVID 19 infection complicated by acute bronchitis,CXR negative, given bronchitis and patient has an intolerance to oral prednisone, will cover with Doxycyline. Decadron 10 mg IM given.  N&V, and SOB. Nebulizer machine with Duo nebs prescribed every 6 hours as needed for wheezing and SOB. Nausea controlled in clinic with Zofran, prescribed for home management of nausea. Red flags discussed that warranted ER evaluation. Oxygen level is stable at 96% and vital signs are unremarkable.  Patient has cough medication at home and will continue as needed. Patient verbalized understanding and agreement and plan. Final Clinical Impressions(s) / UC Diagnoses   Final diagnoses:  Bronchitis due to COVID-19 virus  Nausea without vomiting  Shortness of breath     Discharge Instructions     Continue cough medication for management of cough. Administer nebulizer treatment every 6 hours as needed shortness of breath and wheezing. Complete Doxycyline.  ER if shortness of breath or oxygen level worsens.    ED Prescriptions    Medication Sig Dispense Auth. Provider   ipratropium-albuterol (DUONEB) 0.5-2.5 (3) MG/3ML SOLN Take 3 mLs by nebulization every 6 (six) hours as needed. 120 mL Scot Jun, FNP   ondansetron (ZOFRAN ODT) 4 MG disintegrating tablet Take 1 tablet (4 mg total) by mouth every 8 (eight) hours as needed for nausea or vomiting. 20 tablet Scot Jun, FNP   doxycycline (MONODOX) 100 MG capsule Take 1 capsule (100 mg total) by mouth 2 (two) times daily for 10 days. 20 capsule Scot Jun, FNP     PDMP not reviewed this encounter.   Scot Jun, [place] 01/17/21 [phone]

## 2021-01-15 NOTE — ED Triage Notes (Signed)
Patient presents to Urgent Care with complaints of cough since 2 weeks ago. Patient reports she was diagnosed with covid 8 days ago, cough and nausea have not improved, family requesting patient receive fluids, zofran, chest x-ray, and albuterol.

## 2021-01-15 NOTE — Telephone Encounter (Signed)
Called to discuss with patient about COVID-19 symptoms and the use of one of the available treatments for those with mild to moderate Covid symptoms and at a high risk of hospitalization.  Pt appears to qualify for outpatient treatment due to co-morbid conditions and/or a member of an at-risk group in accordance with the FDA Emergency Use Authorization.    Symptom onset: Unknown Vaccinated: Unknown Booster? Unknown Immunocompromised? No Qualifiers: yES  Unable to reach pt - Left message with call back number [phone].   Amy [name]

## 2021-01-15 NOTE — Discharge Instructions (Addendum)
Continue cough medication for management of cough. Administer nebulizer treatment every 6 hours as needed shortness of breath and wheezing. Complete Doxycyline.  ER if shortness of breath or oxygen level worsens.

## 2021-01-31 DIAGNOSIS — D0511 Intraductal carcinoma in situ of right breast: Secondary | ICD-10-CM | POA: Diagnosis not present

## 2021-02-04 DIAGNOSIS — H0102A Squamous blepharitis right eye, upper and lower eyelids: Secondary | ICD-10-CM | POA: Diagnosis not present

## 2021-02-04 DIAGNOSIS — Z961 Presence of intraocular lens: Secondary | ICD-10-CM | POA: Diagnosis not present

## 2021-02-04 DIAGNOSIS — H26492 Other secondary cataract, left eye: Secondary | ICD-10-CM | POA: Diagnosis not present

## 2021-02-04 DIAGNOSIS — H401122 Primary open-angle glaucoma, left eye, moderate stage: Secondary | ICD-10-CM | POA: Diagnosis not present

## 2021-02-04 DIAGNOSIS — H43812 Vitreous degeneration, left eye: Secondary | ICD-10-CM | POA: Diagnosis not present

## 2021-02-04 DIAGNOSIS — H0102B Squamous blepharitis left eye, upper and lower eyelids: Secondary | ICD-10-CM | POA: Diagnosis not present

## 2021-02-04 DIAGNOSIS — H401113 Primary open-angle glaucoma, right eye, severe stage: Secondary | ICD-10-CM | POA: Diagnosis not present

## 2021-02-15 DIAGNOSIS — D485 Neoplasm of uncertain behavior of skin: Secondary | ICD-10-CM | POA: Diagnosis not present

## 2021-02-15 DIAGNOSIS — D0472 Carcinoma in situ of skin of left lower limb, including hip: Secondary | ICD-10-CM | POA: Diagnosis not present

## 2021-02-15 DIAGNOSIS — L308 Other specified dermatitis: Secondary | ICD-10-CM | POA: Diagnosis not present

## 2021-02-15 DIAGNOSIS — D045 Carcinoma in situ of skin of trunk: Secondary | ICD-10-CM | POA: Diagnosis not present

## 2021-03-08 DIAGNOSIS — H906 Mixed conductive and sensorineural hearing loss, bilateral: Secondary | ICD-10-CM | POA: Diagnosis not present

## 2021-03-08 DIAGNOSIS — Z9889 Other specified postprocedural states: Secondary | ICD-10-CM | POA: Diagnosis not present

## 2021-03-08 DIAGNOSIS — H90A22 Sensorineural hearing loss, unilateral, left ear, with restricted hearing on the contralateral side: Secondary | ICD-10-CM | POA: Diagnosis not present

## 2021-03-13 DIAGNOSIS — H4089 Other specified glaucoma: Secondary | ICD-10-CM | POA: Diagnosis not present

## 2021-03-29 DIAGNOSIS — D0472 Carcinoma in situ of skin of left lower limb, including hip: Secondary | ICD-10-CM | POA: Diagnosis not present

## 2021-04-25 DIAGNOSIS — Z808 Family history of malignant neoplasm of other organs or systems: Secondary | ICD-10-CM | POA: Diagnosis not present

## 2021-04-25 DIAGNOSIS — Z86018 Personal history of other benign neoplasm: Secondary | ICD-10-CM | POA: Diagnosis not present

## 2021-04-25 DIAGNOSIS — D044 Carcinoma in situ of skin of scalp and neck: Secondary | ICD-10-CM | POA: Diagnosis not present

## 2021-04-25 DIAGNOSIS — D2272 Melanocytic nevi of left lower limb, including hip: Secondary | ICD-10-CM | POA: Diagnosis not present

## 2021-04-25 DIAGNOSIS — Z86006 Personal history of melanoma in-situ: Secondary | ICD-10-CM | POA: Diagnosis not present

## 2021-04-25 DIAGNOSIS — L821 Other seborrheic keratosis: Secondary | ICD-10-CM | POA: Diagnosis not present

## 2021-04-25 DIAGNOSIS — L719 Rosacea, unspecified: Secondary | ICD-10-CM | POA: Diagnosis not present

## 2021-04-25 DIAGNOSIS — L565 Disseminated superficial actinic porokeratosis (DSAP): Secondary | ICD-10-CM | POA: Diagnosis not present

## 2021-04-25 DIAGNOSIS — D485 Neoplasm of uncertain behavior of skin: Secondary | ICD-10-CM | POA: Diagnosis not present

## 2021-04-25 DIAGNOSIS — L570 Actinic keratosis: Secondary | ICD-10-CM | POA: Diagnosis not present

## 2021-04-25 DIAGNOSIS — Z85828 Personal history of other malignant neoplasm of skin: Secondary | ICD-10-CM | POA: Diagnosis not present

## 2021-04-25 DIAGNOSIS — L578 Other skin changes due to chronic exposure to nonionizing radiation: Secondary | ICD-10-CM | POA: Diagnosis not present

## 2021-05-07 DIAGNOSIS — D0511 Intraductal carcinoma in situ of right breast: Secondary | ICD-10-CM | POA: Diagnosis not present

## 2021-05-14 ENCOUNTER — Emergency Department (INDEPENDENT_AMBULATORY_CARE_PROVIDER_SITE_OTHER)
Admission: EM | Admit: 2021-05-14 | Discharge: 2021-05-14 | Disposition: A | Discharge status: Home / Self Care | Payer: Medicare Other | Source: Home / Self Care

## 2021-05-14 ENCOUNTER — Other Ambulatory Visit: Payer: Self-pay

## 2021-05-14 DIAGNOSIS — I10 Essential (primary) hypertension: Secondary | ICD-10-CM | POA: Diagnosis not present

## 2021-05-14 DIAGNOSIS — R600 Localized edema: Secondary | ICD-10-CM

## 2021-05-14 MED ORDER — FUROSEMIDE 40 MG PO TABS: 40.0000 mg | ORAL_TABLET | Freq: Every day | ORAL | 1 refills | Status: DC

## 2021-05-14 NOTE — ED Provider Notes (Signed)
Amy [name] CARE    CSN: [id number] Arrival date & time: 05/14/21  1734      History   Chief Complaint Chief Complaint  Patient presents with  . Hypertension    HPI Amy [name] is a 75 y.o. female.   HPI 75 year old female presents with headache x2 days, has noticed swelling in feet since Friday, 05/10/2021.  Patient reports starting new blood pressure much medicine 2 months ago.  Patient is currently on 2 blood pressure medications HCTZ 25 mg daily and Olmesartan 40 mg.  Reports unable to get in with PCP when attempting today.  Past Medical History:  Diagnosis Date  . Arthritis   . Hypertension   . Skin cancer     Patient Active Problem List   Diagnosis Date Noted  . Body mass index (BMI) of 25.0 to 29.9 04/25/2019  . Menopausal symptom 04/25/2019  . Menopausal syndrome 04/25/2019  . Ductal carcinoma in situ (DCIS) of right breast 05/08/2017    Past Surgical History:  Procedure Laterality Date  . ABDOMINAL HYSTERECTOMY    . FACIAL COSMETIC SURGERY     "eye lid surgery"  . KNEE ARTHROSCOPY Bilateral    torn meniscus on bilateral knees  . MASTECTOMY COMPLETE / SIMPLE W/ SENTINEL NODE BIOPSY Right 05/08/2017  . MASTECTOMY W/ SENTINEL NODE BIOPSY Right 05/08/2017   Procedure: RIGHT MASTECTOMY WITH SENTINEL LYMPH NODE BIOPSY;  Surgeon: Jovita Kussmaul, MD;  Location: [facility];  Service: General;  Laterality: Right;  . NOSE SURGERY     deviated septum   . titanium ear implant Left    "for hearing" left, Dr. Thornell Mule    OB History   No obstetric history on file.      Home Medications    Prior to Admission medications   Medication Sig Start Date End Date Taking? Authorizing Provider  furosemide (LASIX) 40 MG tablet Take 1 tablet (40 mg total) by mouth daily for 5 days. 05/14/21 05/19/21 Yes Eliezer Lofts, FNP  benzonatate (TESSALON) 200 MG capsule benzonatate 200 mg capsule    [provider]  bimatoprost (LUMIGAN) 0.01 % SOLN Lumigan 0.01 % eye  drops  INSTILL 1 DROP INTO BOTH EYES AT BEDTIME    [provider]  brimonidine-timolol (COMBIGAN) 0.2-0.5 % ophthalmic solution Combigan 0.2 %-0.5 % eye drops    [provider]  chlorpheniramine-HYDROcodone (TUSSIONEX) 10-8 MG/5ML SUER hydrocodone 10 mg-chlorpheniramine 8 mg/5 mL oral susp extend.rel 12hr    [provider]  ciprofloxacin-dexamethasone (CIPRODEX) OTIC suspension Ciprodex 0.3 %-0.1 % ear drops,suspension  INSTILL 3 DROPS IN LEFT EAR 3 TIMES/DAY X 1 WK    [provider]  colestipol (COLESTID) 1 g tablet colestipol 1 gram tablet    [provider]  diclofenac sodium (VOLTAREN) 1 % GEL diclofenac 1 % topical gel    [provider]  dorzolamide-timolol (COSOPT) 22.3-6.8 MG/ML ophthalmic solution dorzolamide 22.3 mg-timolol 6.8 mg/mL eye drops    [provider]  estradiol (MINIVELLE) 0.075 MG/24HR Minivelle 0.075 mg/24 hr transdermal patch 11/17/15   [provider]  fluocinonide cream (LIDEX) 0.05 % fluocinonide 0.05 % topical cream    [provider]  fluticasone (FLONASE) 50 MCG/ACT nasal spray fluticasone propionate 50 mcg/actuation nasal spray,suspension    [provider]  FML FORTE 0.25 % ophthalmic suspension INSTILL 1 DROP INTO LEFT EYE AS DIRECTED 12/20/18   [provider]  hydrochlorothiazide (HYDRODIURIL) 25 MG tablet Take 25 mg by mouth daily. 04/01/19   [provider]  HYDROcodone-acetaminophen (NORCO/VICODIN) 5-325 MG tablet hydrocodone 5 mg-acetaminophen 325 mg tablet    [provider]  ipratropium-albuterol (DUONEB) 0.5-2.5 (3) MG/3ML SOLN Take 3 mLs by nebulization every 6 (six) hours as needed. 01/15/21   Scot Jun, FNP  ketorolac (ACULAR) 0.5 % ophthalmic solution INSTILL 1 DROP INTO RIGHT EYE FOUR TIMES A DAY STARTING 1 DAY BEFORE SURGERY 11/24/18   [provider]  latanoprost (XALATAN) 0.005 % ophthalmic solution Place 1 drop  into both eyes at bedtime.    [provider]  lisinopril-hydrochlorothiazide (ZESTORETIC) 20-25 MG tablet lisinopril 20 mg-hydrochlorothiazide 25 mg tablet    [provider]  losartan (COZAAR) 100 MG tablet Take 100 mg by mouth daily. 04/04/19   [provider]  losartan-hydrochlorothiazide (HYZAAR) 100-25 MG tablet Take 1 tablet by mouth daily before breakfast. 11/02/16   [provider]  olmesartan (BENICAR) 40 MG tablet Take 40 mg by mouth daily. 04/04/21   [provider]  olopatadine (PATANOL) 0.1 % ophthalmic solution olopatadine 0.1 % eye drops    [provider]  ondansetron (ZOFRAN ODT) 4 MG disintegrating tablet Take 1 tablet (4 mg total) by mouth every 8 (eight) hours as needed for nausea or vomiting. 01/15/21   Scot Jun, FNP  oxyCODONE (OXY IR/ROXICODONE) 5 MG immediate release tablet Take 1 tablet (5 mg total) by mouth every 6 (six) hours as needed for moderate pain, severe pain or breakthrough pain. 05/09/17   Greer Pickerel, MD  prednisoLONE acetate (PRED FORTE) 1 % ophthalmic suspension prednisolone acetate 1 % eye drops,suspension    [provider]  ROCKLATAN 0.02-0.005 % SOLN Place 1 drop into both eyes at bedtime. 11/05/18   [provider]  rosuvastatin (CRESTOR) 10 MG tablet TAKE 1 TABLET BY MOUTH ONE TIME PER WEEK 03/17/19   [provider]  timolol (BETIMOL) 0.5 % ophthalmic solution Place 1 drop into both eyes 2 (two) times daily.    [provider]  timolol (TIMOPTIC) 0.5 % ophthalmic solution Place 1 drop into both eyes 2 (two) times daily.    [provider]  triamcinolone cream (KENALOG) 0.1 % triamcinolone acetonide 0.1 % topical cream    [provider]    Family History Family History  Problem Relation Age of Onset  . Cancer Father 35       prostate cancer   . Cancer Sister 36       breast cancer   . Hypertension Mother     Social History Social  History   Tobacco Use  . Smoking status: Never Smoker  . Smokeless tobacco: Never Used  Vaping Use  . Vaping Use: Never used  Substance Use Topics  . Alcohol use: Yes    Comment: occasionally a glass of wine   . Drug use: No     Allergies   Meperidine, Other, and Methylprednisolone   Review of Systems Review of Systems  Constitutional: Negative.   HENT: Negative.   Respiratory: Negative.   Cardiovascular: Positive for leg swelling.  Gastrointestinal: Negative.   Genitourinary: Negative.   Musculoskeletal: Negative.   Skin: Negative.   Neurological: Negative.      Physical Exam Triage Vital Signs ED Triage Vitals  Enc Vitals Group     BP 05/14/21 1753 (!) 192/81     Pulse Rate 05/14/21 1749 82     Resp 05/14/21 1749 18     Temp 05/14/21 1753 98 F (36.7 C)     Temp Source  05/14/21 1749 Oral     SpO2 05/14/21 1749 98 %     Weight --      Height --      Head Circumference --      Peak Flow --      Pain Score 05/14/21 1749 4     Pain Loc --      Pain Edu? --      Excl. in [place]? --    No data found.  Updated Vital Signs BP (!) 192/81   Pulse 82   Temp 98 F (36.7 C) (Oral)   Resp 18   SpO2 98%   Physical Exam Vitals and nursing note reviewed.  Constitutional:      General: She is not in acute distress.    Appearance: Normal appearance. She is obese. She is not ill-appearing.  HENT:     Head: Normocephalic and atraumatic.     Mouth/Throat:     Mouth: Mucous membranes are moist.     Pharynx: Oropharynx is clear.  Eyes:     Extraocular Movements: Extraocular movements intact.     Conjunctiva/sclera: Conjunctivae normal.     Pupils: Pupils are equal, round, and reactive to light.  Neck:     Comments: No JVD, no bruit Cardiovascular:     Rate and Rhythm: Normal rate and regular rhythm.     Pulses: Normal pulses.     Heart sounds: Normal heart sounds.     Comments: +2 pitting/nonpitting edema noted bilaterally; PT/DP +1 Pulmonary:     Effort:  Pulmonary effort is normal.     Breath sounds: Normal breath sounds.     Comments: No adventitious breath sounds noted Musculoskeletal:        General: Normal range of motion.     Cervical back: Normal range of motion and neck supple.  Skin:    General: Skin is warm and dry.  Neurological:     General: No focal deficit present.     Mental Status: She is alert and oriented to person, place, and time.  Psychiatric:        Mood and Affect: Mood normal.        Behavior: Behavior normal.      UC Treatments / Results  Labs (all labs ordered are listed, but only abnormal results are displayed) Labs Reviewed - No data to display  EKG   Radiology No results found.  Procedures Procedures (including critical care time)  Medications Ordered in UC Medications - No data to display  Initial Impression / Assessment and Plan / UC Course  I have reviewed the triage vital signs and the nursing notes.  Pertinent labs & imaging results that were available during my care of the patient were reviewed by me and considered in my medical decision making (see chart for details).     MDM: 1.  Bilateral lower extremity edema, 2.  Hypertension.  Patient established with [facility] PCP prior to discharge tonight.  Patient discharged home, hemodynamically stable. Final Clinical Impressions(s) / UC Diagnoses   Final diagnoses:  Bilateral lower extremity edema  Essential hypertension     Discharge Instructions     Advised patient to take Lasix 40 mg daily x 5 days as prescribed starting tomorrow morning, Wednesday, 05/15/2021.  Instructed patient to hold HCTZ for 5 days while taking Lasix.  Advised/encouraged patient to check blood pressure twice daily (a.m. prior to back breakfast and p.m. prior to dinner).  Advised patient to record measurements for new PCP  to establish daily blood pressure trends.    ED Prescriptions    Medication Sig Dispense Auth. Provider   furosemide (LASIX) 40 MG tablet Take  1 tablet (40 mg total) by mouth daily for 5 days. 5 tablet Eliezer Lofts, FNP     PDMP not reviewed this encounter.   Eliezer Lofts, [place] 05/14/21 1851

## 2021-05-14 NOTE — Discharge Instructions (Addendum)
Advised patient to take Lasix 40 mg daily x 5 days as prescribed starting tomorrow morning, Wednesday, 05/15/2021.  Instructed patient to hold HCTZ for 5 days while taking Lasix.  Advised/encouraged patient to check blood pressure twice daily (a.m. prior to back breakfast and p.m. prior to dinner).  Advised patient to record measurements for new PCP to establish daily blood pressure trends.

## 2021-05-14 NOTE — ED Triage Notes (Signed)
Pt c/o headache x 2 days. Also noticed swelling in feet since Fri. Checked blood pressure at home today which was 199/94. New BP meds 2 months ago.

## 2021-05-15 ENCOUNTER — Emergency Department (INDEPENDENT_AMBULATORY_CARE_PROVIDER_SITE_OTHER)
Admission: EM | Admit: 2021-05-15 | Discharge: 2021-05-15 | Disposition: A | Discharge status: Home / Self Care | Payer: Medicare Other | Source: Home / Self Care

## 2021-05-15 ENCOUNTER — Telehealth: Payer: Self-pay | Admitting: Emergency Medicine

## 2021-05-15 ENCOUNTER — Emergency Department (HOSPITAL_COMMUNITY)
Admission: EM | Admit: 2021-05-15 | Discharge: 2021-05-15 | Disposition: A | Discharge status: Home / Self Care | Payer: Medicare Other | Attending: Emergency Medicine | Admitting: Emergency Medicine

## 2021-05-15 ENCOUNTER — Encounter: Payer: Self-pay | Admitting: Emergency Medicine

## 2021-05-15 ENCOUNTER — Emergency Department (HOSPITAL_BASED_OUTPATIENT_CLINIC_OR_DEPARTMENT_OTHER)
Admit: 2021-05-15 | Discharge: 2021-05-15 | Disposition: A | Discharge status: Home / Self Care | Payer: Medicare Other | Attending: Emergency Medicine | Admitting: Emergency Medicine

## 2021-05-15 ENCOUNTER — Encounter (HOSPITAL_COMMUNITY): Payer: Self-pay | Admitting: *Deleted

## 2021-05-15 DIAGNOSIS — I10 Essential (primary) hypertension: Secondary | ICD-10-CM | POA: Diagnosis not present

## 2021-05-15 DIAGNOSIS — M7989 Other specified soft tissue disorders: Secondary | ICD-10-CM | POA: Diagnosis not present

## 2021-05-15 DIAGNOSIS — R609 Edema, unspecified: Secondary | ICD-10-CM

## 2021-05-15 DIAGNOSIS — Z79899 Other long term (current) drug therapy: Secondary | ICD-10-CM | POA: Insufficient documentation

## 2021-05-15 DIAGNOSIS — Z86000 Personal history of in-situ neoplasm of breast: Secondary | ICD-10-CM | POA: Insufficient documentation

## 2021-05-15 DIAGNOSIS — Z85828 Personal history of other malignant neoplasm of skin: Secondary | ICD-10-CM | POA: Insufficient documentation

## 2021-05-15 DIAGNOSIS — R600 Localized edema: Secondary | ICD-10-CM | POA: Diagnosis not present

## 2021-05-15 LAB — CBC WITH DIFFERENTIAL/PLATELET
Abs Immature Granulocytes: 0.02 10*3/uL (ref 0.00–0.07)
Basophils Absolute: 0.1 10*3/uL (ref 0.0–0.1)
Basophils Relative: 1 %
Eosinophils Absolute: 0.6 10*3/uL — ABNORMAL HIGH (ref 0.0–0.5)
Eosinophils Relative: 10 %
HCT: 37.9 % (ref 36.0–46.0)
Hemoglobin: 12.8 g/dL (ref 12.0–15.0)
Immature Granulocytes: 0 %
Lymphocytes Relative: 23 %
Lymphs Abs: 1.4 10*3/uL (ref 0.7–4.0)
MCH: 29.2 pg (ref 26.0–34.0)
MCHC: 33.8 g/dL (ref 30.0–36.0)
MCV: 86.5 fL (ref 80.0–100.0)
Monocytes Absolute: 0.4 10*3/uL (ref 0.1–1.0)
Monocytes Relative: 7 %
Neutro Abs: 3.5 10*3/uL (ref 1.7–7.7)
Neutrophils Relative %: 59 %
Platelets: 270 10*3/uL (ref 150–400)
RBC: 4.38 MIL/uL (ref 3.87–5.11)
RDW: 12.9 % (ref 11.5–15.5)
WBC: 6 10*3/uL (ref 4.0–10.5)
nRBC: 0 % (ref 0.0–0.2)

## 2021-05-15 LAB — COMPREHENSIVE METABOLIC PANEL
ALT: 36 U/L (ref 0–44)
AST: 37 U/L (ref 15–41)
Albumin: 4.4 g/dL (ref 3.5–5.0)
Alkaline Phosphatase: 96 U/L (ref 38–126)
Anion gap: 11 (ref 5–15)
BUN: 26 mg/dL — ABNORMAL HIGH (ref 8–23)
CO2: 28 mmol/L (ref 22–32)
Calcium: 10.5 mg/dL — ABNORMAL HIGH (ref 8.9–10.3)
Chloride: 97 mmol/L — ABNORMAL LOW (ref 98–111)
Creatinine, Ser: 1.13 mg/dL — ABNORMAL HIGH (ref 0.44–1.00)
GFR, Estimated: 51 mL/min — ABNORMAL LOW (ref >60)
Glucose, Bld: 93 mg/dL (ref 70–99)
Potassium: 4.4 mmol/L (ref 3.5–5.1)
Sodium: 136 mmol/L (ref 135–145)
Total Bilirubin: 1 mg/dL (ref 0.3–1.2)
Total Protein: 7.7 g/dL (ref 6.5–8.1)

## 2021-05-15 LAB — URINALYSIS, ROUTINE W REFLEX MICROSCOPIC
Bilirubin Urine: NEGATIVE
Glucose, UA: NEGATIVE mg/dL
Hgb urine dipstick: NEGATIVE
Ketones, ur: NEGATIVE mg/dL
Leukocytes,Ua: NEGATIVE
Nitrite: NEGATIVE
Protein, ur: NEGATIVE mg/dL
Specific Gravity, Urine: 1.004 — ABNORMAL LOW (ref 1.005–1.030)
pH: 5 (ref 5.0–8.0)

## 2021-05-15 MED ORDER — ACETAMINOPHEN 500 MG PO TABS
1000.0000 mg | ORAL_TABLET | Freq: Once | ORAL | Status: AC
  Administered 2021-05-15: 1000 mg via ORAL
  Filled 2021-05-15: qty 2

## 2021-05-15 MED ORDER — CLONIDINE HCL 0.1 MG PO TABS
0.1000 mg | ORAL_TABLET | Freq: Once | ORAL | Status: AC
  Administered 2021-05-15: 0.1 mg via ORAL
  Filled 2021-05-15: qty 1

## 2021-05-15 NOTE — Progress Notes (Signed)
Bilateral lower extremity venous duplex has been completed. Preliminary results can be found in CV Proc through chart review.  Results were given to Dr. Francia Greaves.  05/15/21 4:26 PM Carlos Levering RVT

## 2021-05-15 NOTE — ED Provider Notes (Signed)
[facility] DEPT Provider Note   CSN: [id number] Arrival date & time: 05/15/21  1403     History Chief Complaint  Patient presents with  . Hypertension    Amy [name] is a 75 y.o. female.  75 year old female with prior medical history as detailed below presents for evaluation.  Patient reports increased lower extremity edema over the last week.  She reports bilateral equal swelling.  She reports prior episodes of similar edema in the past.  She reports that she was seen in urgent care late last week for same complaint.  She was advised to stop taking her hydrochlorothiazide for 5 days.  She is also prescribed 40 mg of Lasix daily for 5 days.  Her first dose of Lasix was taken yesterday.  She reports minimal improvement in her bilateral lower extremity edema with Lasix administration.  She is primarily complaining today of elevated BP.  She noted this this morning when she did a blood pressure check at home.  She reports that typically her blood pressure is well controlled in the 403K to 742V systolic range.  She denies associated chest pain, fever, cough, shortness of breath, or other acute complaint.   The history is provided by the patient and medical records.  Illness Location:  Lower extremity edema, hypertension Severity:  Mild Onset quality:  Gradual Duration:  1 week Timing:  Constant Progression:  Unchanged Chronicity:  New      Past Medical History:  Diagnosis Date  . Arthritis   . Hypertension   . Skin cancer     Patient Active Problem List   Diagnosis Date Noted  . Body mass index (BMI) of 25.0 to 29.9 04/25/2019  . Menopausal symptom 04/25/2019  . Menopausal syndrome 04/25/2019  . Ductal carcinoma in situ (DCIS) of right breast 05/08/2017    Past Surgical History:  Procedure Laterality Date  . ABDOMINAL HYSTERECTOMY    . FACIAL COSMETIC SURGERY     "eye lid surgery"  . KNEE ARTHROSCOPY Bilateral    torn meniscus  on bilateral knees  . MASTECTOMY COMPLETE / SIMPLE W/ SENTINEL NODE BIOPSY Right 05/08/2017  . MASTECTOMY W/ SENTINEL NODE BIOPSY Right 05/08/2017   Procedure: RIGHT MASTECTOMY WITH SENTINEL LYMPH NODE BIOPSY;  Surgeon: Jovita Kussmaul, MD;  Location: [facility];  Service: General;  Laterality: Right;  . NOSE SURGERY     deviated septum   . titanium ear implant Left    "for hearing" left, Dr. Thornell Mule     OB History   No obstetric history on file.     Family History  Problem Relation Age of Onset  . Cancer Father 54       prostate cancer   . Cancer Sister 34       breast cancer   . Hypertension Mother     Social History   Tobacco Use  . Smoking status: Never Smoker  . Smokeless tobacco: Never Used  Vaping Use  . Vaping Use: Never used  Substance Use Topics  . Alcohol use: Yes    Comment: occasionally a glass of wine   . Drug use: No    Home Medications Prior to Admission medications   Medication Sig Start Date End Date Taking? Authorizing Provider  benzonatate (TESSALON) 200 MG capsule benzonatate 200 mg capsule    [provider]  bimatoprost (LUMIGAN) 0.01 % SOLN Lumigan 0.01 % eye drops  INSTILL 1 DROP INTO BOTH EYES AT BEDTIME    [provider]  brimonidine-timolol (COMBIGAN) 0.2-0.5 % ophthalmic solution Combigan 0.2 %-0.5 % eye drops    [provider]  chlorpheniramine-HYDROcodone (TUSSIONEX) 10-8 MG/5ML SUER hydrocodone 10 mg-chlorpheniramine 8 mg/5 mL oral susp extend.rel 12hr    [provider]  ciprofloxacin-dexamethasone (CIPRODEX) OTIC suspension Ciprodex 0.3 %-0.1 % ear drops,suspension  INSTILL 3 DROPS IN LEFT EAR 3 TIMES/DAY X 1 WK    [provider]  colestipol (COLESTID) 1 g tablet colestipol 1 gram tablet    [provider]  diclofenac sodium (VOLTAREN) 1 % GEL diclofenac 1 % topical gel    [provider]  dorzolamide-timolol (COSOPT) 22.3-6.8 MG/ML ophthalmic solution dorzolamide 22.3  mg-timolol 6.8 mg/mL eye drops    [provider]  estradiol (MINIVELLE) 0.075 MG/24HR Minivelle 0.075 mg/24 hr transdermal patch 11/17/15   [provider]  fluocinonide cream (LIDEX) 0.05 % fluocinonide 0.05 % topical cream    [provider]  fluticasone (FLONASE) 50 MCG/ACT nasal spray fluticasone propionate 50 mcg/actuation nasal spray,suspension    [provider]  FML FORTE 0.25 % ophthalmic suspension INSTILL 1 DROP INTO LEFT EYE AS DIRECTED 12/20/18   [provider]  furosemide (LASIX) 40 MG tablet Take 1 tablet (40 mg total) by mouth daily for 5 days. 05/14/21 05/19/21  Eliezer Lofts, FNP  hydrochlorothiazide (HYDRODIURIL) 25 MG tablet Take 25 mg by mouth daily. 04/01/19   [provider]  HYDROcodone-acetaminophen (NORCO/VICODIN) 5-325 MG tablet hydrocodone 5 mg-acetaminophen 325 mg tablet    [provider]  ipratropium-albuterol (DUONEB) 0.5-2.5 (3) MG/3ML SOLN Take 3 mLs by nebulization every 6 (six) hours as needed. 01/15/21   Scot Jun, FNP  ketorolac (ACULAR) 0.5 % ophthalmic solution INSTILL 1 DROP INTO RIGHT EYE FOUR TIMES A DAY STARTING 1 DAY BEFORE SURGERY 11/24/18   [provider]  latanoprost (XALATAN) 0.005 % ophthalmic solution Place 1 drop into both eyes at bedtime.    [provider]  lisinopril-hydrochlorothiazide (ZESTORETIC) 20-25 MG tablet lisinopril 20 mg-hydrochlorothiazide 25 mg tablet    [provider]  losartan (COZAAR) 100 MG tablet Take 100 mg by mouth daily. Patient not taking: Reported on 05/15/2021 04/04/19   [provider]  losartan-hydrochlorothiazide (HYZAAR) 100-25 MG tablet Take 1 tablet by mouth daily before breakfast. 11/02/16   [provider]  olmesartan (BENICAR) 40 MG tablet Take 40 mg by mouth daily. 04/04/21   [provider]  olopatadine (PATANOL) 0.1 % ophthalmic solution olopatadine 0.1 % eye drops    [provider]  ondansetron (ZOFRAN ODT) 4 MG disintegrating tablet Take 1 tablet (4 mg total) by mouth every 8 (eight) hours as needed for nausea or vomiting. 01/15/21   Scot Jun, FNP  oxyCODONE (OXY IR/ROXICODONE) 5 MG immediate release tablet Take 1 tablet (5 mg total) by mouth every 6 (six) hours as needed for moderate pain, severe pain or breakthrough pain. 05/09/17   Greer Pickerel, MD  prednisoLONE acetate (PRED FORTE) 1 % ophthalmic suspension prednisolone acetate 1 % eye drops,suspension    [provider]  ROCKLATAN 0.02-0.005 % SOLN Place 1 drop into both eyes at bedtime. 11/05/18   [provider]  rosuvastatin (CRESTOR) 10 MG tablet TAKE 1 TABLET BY MOUTH ONE TIME PER WEEK 03/17/19   [provider]  timolol (BETIMOL) 0.5 % ophthalmic solution Place 1 drop into both eyes 2 (two) times daily.    [provider]  timolol (TIMOPTIC) 0.5 % ophthalmic solution Place 1 drop into both eyes  2 (two) times daily.    [provider]  triamcinolone cream (KENALOG) 0.1 % triamcinolone acetonide 0.1 % topical cream    [provider]    Allergies    Meperidine, Other, and Methylprednisolone  Review of Systems   Review of Systems  All other systems reviewed and are negative.   Physical Exam Updated Vital Signs BP (!) 207/82   Pulse 68   Temp 97.8 F (36.6 C) (Oral)   Resp 18   Ht 5\' 8"  (1.727 m)   Wt 86.6 kg   SpO2 100%   BMI 29.04 kg/m   Physical Exam Vitals and nursing note reviewed.  Constitutional:      General: She is not in acute distress.    Appearance: Normal appearance. She is well-developed.  HENT:     Head: Normocephalic and atraumatic.  Eyes:     Conjunctiva/sclera: Conjunctivae normal.     Pupils: Pupils are equal, round, and reactive to light.  Cardiovascular:     Rate and Rhythm: Normal rate and regular rhythm.     Heart sounds: Normal heart sounds.  Pulmonary:     Effort: Pulmonary effort is normal. No  respiratory distress.     Breath sounds: Normal breath sounds.  Abdominal:     General: There is no distension.     Palpations: Abdomen is soft.     Tenderness: There is no abdominal tenderness.  Musculoskeletal:        General: No deformity. Normal range of motion.     Cervical back: Normal range of motion and neck supple.     Right lower leg: Edema present.     Left lower leg: Edema present.     Comments: 1-2 plus bilateral lower extremity edema   Skin:    General: Skin is warm and dry.  Neurological:     General: No focal deficit present.     Mental Status: She is alert and oriented to person, place, and time.     ED Results / Procedures / Treatments   Labs (all labs ordered are listed, but only abnormal results are displayed) Labs Reviewed  COMPREHENSIVE METABOLIC PANEL  CBC WITH DIFFERENTIAL/PLATELET  URINALYSIS, ROUTINE W REFLEX MICROSCOPIC    EKG None  Radiology No results found.  Procedures Procedures   Medications Ordered in ED Medications  acetaminophen (TYLENOL) tablet 1,000 mg (has no administration in time range)  cloNIDine (CATAPRES) tablet 0.1 mg (has no administration in time range)    ED Course  I have reviewed the triage vital signs and the nursing notes.  Pertinent labs & imaging results that were available during my care of the patient were reviewed by me and considered in my medical decision making (see chart for details).    MDM Rules/Calculators/A&P                          MDM  MSE complete  Amy [name] was evaluated in Emergency Department on 05/15/2021 for the symptoms described in the history of present illness. She was evaluated in the context of the global COVID-19 pandemic, which necessitated consideration that the patient might be at risk for infection with the SARS-CoV-2 virus that causes COVID-19. Institutional protocols and algorithms that pertain to the evaluation of patients at risk for COVID-19 are in a state of rapid  change based on information released by regulatory bodies including the CDC and federal and state organizations. These policies and algorithms were followed  during the patient's care in the ED.   Patient presented with complaint of bilateral lower extremity edema.  She also noted elevated blood pressures at home earlier today.   Labs obtained are without significant abnormality. No evidence of DVT.   Patient's blood pressure improved during the ED evaluation.  Patient is advised to resume her hydrochlorothiazide as she had previously stopped it while taking Lasix.  She is advised to take only 2 more days of Lasix (for a total of 3).  Close follow-up is stressed with her regular care providers with the next 3-4 days.  Strict return precautions given and understood.     Final Clinical Impression(s) / ED Diagnoses Final diagnoses:  Edema, unspecified type    Rx / DC Orders ED Discharge Orders    None       Valarie Merino, MD 05/15/21 [phone]

## 2021-05-15 NOTE — Discharge Instructions (Signed)
Please go to Emergency Department at [facility] for further care and management.  [place], [place]

## 2021-05-15 NOTE — Telephone Encounter (Signed)
Call from [facility] regarding her BP remaining elevated & legs still being swollen. RN spoke w/ provider here today Aquilla Solian, PA). Provider requests Amy [name] follow up here today for labs or in the Ed or with her PCP. Amy [name] stated she would return to [facility] now for a recheck & labs

## 2021-05-15 NOTE — ED Notes (Signed)
Patient is being discharged from the Urgent Care and sent to the Emergency Department via POV w/ her daughter. Pt to [facility] ED.  Per J. Woods, [place] , patient is in need of higher level of care due to need for labs, difficult IV stick, BLE edema & HTN . Patient is aware and verbalizes understanding of plan of care.  Vitals:   05/15/21 1244  BP: (!) 191/93  Pulse: 65  Resp: 17  Temp: 99.1 F (37.3 C)  SpO2: 98%  attempt to call report to [facility] triage RN - no answer

## 2021-05-15 NOTE — ED Provider Notes (Addendum)
[facility]  ____________________________________________  Time seen: Approximately 1:08 PM  I have reviewed the triage vital signs and the nursing notes.   HISTORY  Chief Complaint Hypertension   Historian Patient     HPI Amy [name] is a 75 y.o. female presents to the urgent care after being seen and evaluated 1 day ago.  Patient presents with persistent bilateral lower extremity edema as well as headache and uncontrolled blood pressure at home.  Patient was started on Lasix 40 mg this morning and was told to hold her hydrochlorothiazide for 5 days.  Patient has been taking her losartan as directed.  Patient states that her blood pressure has been elevated this morning and her lower extremity edema is mostly the same.  She denies pleuritic chest pain, shortness of breath or cough.  Patient has not used increased pillows for sleep.  No paroxysmal nocturnal dyspnea.  Patient states that she has not been under increased stress at home.    Past Medical History:  Diagnosis Date  . Arthritis   . Hypertension   . Skin cancer      Immunizations up to date:  Yes.     Past Medical History:  Diagnosis Date  . Arthritis   . Hypertension   . Skin cancer     Patient Active Problem List   Diagnosis Date Noted  . Body mass index (BMI) of 25.0 to 29.9 04/25/2019  . Menopausal symptom 04/25/2019  . Menopausal syndrome 04/25/2019  . Ductal carcinoma in situ (DCIS) of right breast 05/08/2017    Past Surgical History:  Procedure Laterality Date  . ABDOMINAL HYSTERECTOMY    . FACIAL COSMETIC SURGERY     "eye lid surgery"  . KNEE ARTHROSCOPY Bilateral    torn meniscus on bilateral knees  . MASTECTOMY COMPLETE / SIMPLE W/ SENTINEL NODE BIOPSY Right 05/08/2017  . MASTECTOMY W/ SENTINEL NODE BIOPSY Right 05/08/2017   Procedure: RIGHT MASTECTOMY WITH SENTINEL LYMPH NODE BIOPSY;  Surgeon: Jovita Kussmaul, MD;  Location: [facility];  Service: General;  Laterality: Right;   . NOSE SURGERY     deviated septum   . titanium ear implant Left    "for hearing" left, Dr. Thornell Mule    Prior to Admission medications   Medication Sig Start Date End Date Taking? Authorizing Provider  olmesartan (BENICAR) 40 MG tablet Take 40 mg by mouth daily. 04/04/21  Yes [provider]  benzonatate (TESSALON) 200 MG capsule benzonatate 200 mg capsule    [provider]  bimatoprost (LUMIGAN) 0.01 % SOLN Lumigan 0.01 % eye drops  INSTILL 1 DROP INTO BOTH EYES AT BEDTIME    [provider]  brimonidine-timolol (COMBIGAN) 0.2-0.5 % ophthalmic solution Combigan 0.2 %-0.5 % eye drops    [provider]  chlorpheniramine-HYDROcodone (TUSSIONEX) 10-8 MG/5ML SUER hydrocodone 10 mg-chlorpheniramine 8 mg/5 mL oral susp extend.rel 12hr    [provider]  ciprofloxacin-dexamethasone (CIPRODEX) OTIC suspension Ciprodex 0.3 %-0.1 % ear drops,suspension  INSTILL 3 DROPS IN LEFT EAR 3 TIMES/DAY X 1 WK    [provider]  colestipol (COLESTID) 1 g tablet colestipol 1 gram tablet    [provider]  diclofenac sodium (VOLTAREN) 1 % GEL diclofenac 1 % topical gel    [provider]  dorzolamide-timolol (COSOPT) 22.3-6.8 MG/ML ophthalmic solution dorzolamide 22.3 mg-timolol 6.8 mg/mL eye drops    [provider]  estradiol (MINIVELLE) 0.075 MG/24HR Minivelle 0.075 mg/24 hr transdermal patch 11/17/15   [provider]  fluocinonide cream (LIDEX) 0.05 % fluocinonide 0.05 % topical cream    [provider]  fluticasone (FLONASE) 50 MCG/ACT nasal spray fluticasone propionate 50 mcg/actuation nasal spray,suspension    [provider]  FML FORTE 0.25 % ophthalmic suspension INSTILL 1 DROP INTO LEFT EYE AS DIRECTED 12/20/18   [provider]  furosemide (LASIX) 40 MG tablet Take 1 tablet (40 mg total) by mouth daily for 5 days. 05/14/21 05/19/21  Eliezer Lofts, FNP  hydrochlorothiazide (HYDRODIURIL)  25 MG tablet Take 25 mg by mouth daily. 04/01/19   [provider]  HYDROcodone-acetaminophen (NORCO/VICODIN) 5-325 MG tablet hydrocodone 5 mg-acetaminophen 325 mg tablet    [provider]  ipratropium-albuterol (DUONEB) 0.5-2.5 (3) MG/3ML SOLN Take 3 mLs by nebulization every 6 (six) hours as needed. 01/15/21   Scot Jun, FNP  ketorolac (ACULAR) 0.5 % ophthalmic solution INSTILL 1 DROP INTO RIGHT EYE FOUR TIMES A DAY STARTING 1 DAY BEFORE SURGERY 11/24/18   [provider]  latanoprost (XALATAN) 0.005 % ophthalmic solution Place 1 drop into both eyes at bedtime.    [provider]  lisinopril-hydrochlorothiazide (ZESTORETIC) 20-25 MG tablet lisinopril 20 mg-hydrochlorothiazide 25 mg tablet    [provider]  losartan (COZAAR) 100 MG tablet Take 100 mg by mouth daily. Patient not taking: Reported on 05/15/2021 04/04/19   [provider]  losartan-hydrochlorothiazide (HYZAAR) 100-25 MG tablet Take 1 tablet by mouth daily before breakfast. 11/02/16   [provider]  olopatadine (PATANOL) 0.1 % ophthalmic solution olopatadine 0.1 % eye drops    [provider]  ondansetron (ZOFRAN ODT) 4 MG disintegrating tablet Take 1 tablet (4 mg total) by mouth every 8 (eight) hours as needed for nausea or vomiting. 01/15/21   Scot Jun, FNP  oxyCODONE (OXY IR/ROXICODONE) 5 MG immediate release tablet Take 1 tablet (5 mg total) by mouth every 6 (six) hours as needed for moderate pain, severe pain or breakthrough pain. 05/09/17   Greer Pickerel, MD  prednisoLONE acetate (PRED FORTE) 1 % ophthalmic suspension prednisolone acetate 1 % eye drops,suspension    [provider]  ROCKLATAN 0.02-0.005 % SOLN Place 1 drop into both eyes at bedtime. 11/05/18   [provider]  rosuvastatin (CRESTOR) 10 MG tablet TAKE 1 TABLET BY MOUTH ONE TIME PER WEEK 03/17/19   [provider]  timolol (BETIMOL) 0.5 % ophthalmic  solution Place 1 drop into both eyes 2 (two) times daily.    [provider]  timolol (TIMOPTIC) 0.5 % ophthalmic solution Place 1 drop into both eyes 2 (two) times daily.    [provider]  triamcinolone cream (KENALOG) 0.1 % triamcinolone acetonide 0.1 % topical cream    [provider]    Allergies Meperidine, Other, and Methylprednisolone  Family History  Problem Relation Age of Onset  . Cancer Father 43       prostate cancer   . Cancer Sister 59       breast cancer   . Hypertension Mother     Social History Social History   Tobacco Use  . Smoking status: Never Smoker  . Smokeless tobacco: Never Used  Vaping Use  . Vaping Use: Never used  Substance Use Topics  . Alcohol use: Yes    Comment: occasionally a glass of wine   . Drug use: No     Review of Systems  Constitutional: No fever/chills Eyes:  No discharge ENT: No upper respiratory complaints. Respiratory: no cough. No SOB/ use of  accessory muscles to breath Gastrointestinal:   No nausea, no vomiting.  No diarrhea.  No constipation. Musculoskeletal: Negative for musculoskeletal pain. Skin: Patient has bilateral lower extremity edema.     ____________________________________________   PHYSICAL EXAM:  VITAL SIGNS: ED Triage Vitals  Enc Vitals Group     BP 05/15/21 1244 (!) 191/93     Pulse Rate 05/15/21 1244 65     Resp 05/15/21 1244 17     Temp 05/15/21 1244 99.1 F (37.3 C)     Temp Source 05/15/21 1244 Oral     SpO2 05/15/21 1244 98 %     Weight 05/15/21 1248 191 lb (86.6 kg)     Height --      Head Circumference --      Peak Flow --      Pain Score 05/15/21 1248 4     Pain Loc --      Pain Edu? --      Excl. in [place]? --      Constitutional: Alert and oriented. Well appearing and in no acute distress. Eyes: Conjunctivae are normal. PERRL. EOMI. Head: Atraumatic. ENT:       Nose: No congestion/rhinnorhea.      Mouth/Throat: Mucous membranes are moist.  Neck:  No stridor.  No cervical spine tenderness to palpation. Cardiovascular: Normal rate, regular rhythm. Normal S1 and S2.  Good peripheral circulation. Respiratory: Normal respiratory effort without tachypnea or retractions. Lungs CTAB. Good air entry to the bases with no decreased or absent breath sounds Gastrointestinal: Bowel sounds x 4 quadrants. Soft and nontender to palpation. No guarding or rigidity. No distention. Musculoskeletal: Full range of motion to all extremities. No obvious deformities noted.  Palpable dorsalis pedis pulse bilaterally and symmetrically.  Capillary refill less than 2 seconds bilaterally. Neurologic:  Normal for age. No gross focal neurologic deficits are appreciated.  Skin: Patient has 3+ pitting edema. Psychiatric: Mood and affect are normal for age. Speech and behavior are normal.   ____________________________________________   LABS (all labs ordered are listed, but only abnormal results are displayed)  Labs Reviewed  CBC WITH DIFFERENTIAL/PLATELET  COMPLETE METABOLIC PANEL WITH GFR  BRAIN NATRIURETIC PEPTIDE   ____________________________________________  EKG   ____________________________________________  RADIOLOGY   No results found.  ____________________________________________    PROCEDURES  Procedure(s) performed:     Procedures     Medications - No data to display   ____________________________________________   INITIAL IMPRESSION / ASSESSMENT AND PLAN / ED COURSE  Pertinent labs & imaging results that were available during my care of the patient were reviewed by me and considered in my medical decision making (see chart for details).      Assessment and Plan:  Lower extremity edema:  75 year old female presents to the urgent care with persistent bilateral lower extremity edema and hypertension at home.  Patient was hypertensive at triage at 191/93 but vital signs were otherwise reassuring.  She had 3+ pitting edema  of the lower extremities bilaterally.  Will obtain CBC, CMP and BNP and will reassess.  We were unable to conduct venipuncture for labs at this urgent care encounter.  Patient was referred to [facility] for further care and management due to limited work-up capacity.      ____________________________________________  FINAL CLINICAL IMPRESSION(S) / ED DIAGNOSES  Final diagnoses:  Swelling of limb      NEW MEDICATIONS STARTED DURING THIS VISIT:  ED Discharge Orders    None  This chart was dictated using voice recognition software/Dragon. Despite best efforts to proofread, errors can occur which can change the meaning. Any change was purely unintentional.     Lannie Fields, PA-C 05/15/21 1312    Vallarie Mare Villanueva, [place] 05/15/21 1335

## 2021-05-15 NOTE — ED Triage Notes (Signed)
Pt here for hypertension. She has headache x 3 days. Also has bilateral lower extremity edema. She reports taking her BP medication as prescribed and was given lasix yesterday for same.

## 2021-05-15 NOTE — Discharge Instructions (Addendum)
Return for any problem.   Resume HCTZ.   Take 2 more days of Lasix as previously prescribed.

## 2021-05-15 NOTE — ED Triage Notes (Signed)
Pt here for BP recheck & labs per phone conversation earlier today Pt weighed 193 lbs yesterday - 191 here in triage  BP checks at home: 0845: 178/83 0910:182/86 1115: 187/90 Headache

## 2021-05-22 DIAGNOSIS — D044 Carcinoma in situ of skin of scalp and neck: Secondary | ICD-10-CM | POA: Diagnosis not present

## 2021-05-23 DIAGNOSIS — Z96651 Presence of right artificial knee joint: Secondary | ICD-10-CM | POA: Diagnosis not present

## 2021-06-11 DIAGNOSIS — Z853 Personal history of malignant neoplasm of breast: Secondary | ICD-10-CM | POA: Diagnosis not present

## 2021-06-11 DIAGNOSIS — Z1231 Encounter for screening mammogram for malignant neoplasm of breast: Secondary | ICD-10-CM | POA: Diagnosis not present

## 2021-06-17 ENCOUNTER — Ambulatory Visit: Payer: Medicare Other | Admitting: Medical-Surgical

## 2021-06-19 ENCOUNTER — Ambulatory Visit (INDEPENDENT_AMBULATORY_CARE_PROVIDER_SITE_OTHER): Payer: Medicare Other | Admitting: Medical-Surgical

## 2021-06-19 ENCOUNTER — Encounter: Payer: Self-pay | Admitting: Medical-Surgical

## 2021-06-19 ENCOUNTER — Other Ambulatory Visit: Payer: Self-pay

## 2021-06-19 VITALS — BP 128/73 | HR 73 | Temp 98.4°F | Ht 65.0 in | Wt 191.7 lb

## 2021-06-19 DIAGNOSIS — R600 Localized edema: Secondary | ICD-10-CM | POA: Diagnosis not present

## 2021-06-19 DIAGNOSIS — Z13220 Encounter for screening for lipoid disorders: Secondary | ICD-10-CM | POA: Diagnosis not present

## 2021-06-19 DIAGNOSIS — Z780 Asymptomatic menopausal state: Secondary | ICD-10-CM

## 2021-06-19 DIAGNOSIS — I10 Essential (primary) hypertension: Secondary | ICD-10-CM

## 2021-06-19 DIAGNOSIS — I129 Hypertensive chronic kidney disease with stage 1 through stage 4 chronic kidney disease, or unspecified chronic kidney disease: Secondary | ICD-10-CM | POA: Insufficient documentation

## 2021-06-19 DIAGNOSIS — Z23 Encounter for immunization: Secondary | ICD-10-CM | POA: Diagnosis not present

## 2021-06-19 DIAGNOSIS — M542 Cervicalgia: Secondary | ICD-10-CM | POA: Insufficient documentation

## 2021-06-19 DIAGNOSIS — Z7689 Persons encountering health services in other specified circumstances: Secondary | ICD-10-CM | POA: Diagnosis not present

## 2021-06-19 MED ORDER — FUROSEMIDE 20 MG PO TABS: ORAL_TABLET | ORAL | 3 refills | Status: DC

## 2021-06-19 MED ORDER — HYDROCHLOROTHIAZIDE 25 MG PO TABS: 25.0000 mg | ORAL_TABLET | Freq: Every day | ORAL | 1 refills | Status: DC

## 2021-06-19 MED ORDER — OLMESARTAN MEDOXOMIL 40 MG PO TABS: 40.0000 mg | ORAL_TABLET | Freq: Every day | ORAL | 1 refills | Status: DC

## 2021-06-19 NOTE — Progress Notes (Signed)
New Patient Office Visit  Subjective:  Patient ID: Amy [name], female    DOB: 09/02/1946  Age: 75 y.o. MRN: [id number]  CC:  Chief Complaint  Patient presents with   Establish Care    HPI Amy [name] presents to establish care.  She is a pleasant 74 year old female who lives in [place] with her husband.  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  She does take frequent trips to the beach and has several hobbies that she enjoys.  Hypertension-currently taking Benicar 40 mg daily and HCTZ 25 mg daily.  Tolerating both medications well without side effects.  She notes that earlier this year, she had an episode where her blood pressure went dangerously high with systolics in the 678L.  She was very concerned and contacted her previous PCP but they were unable to see her.  She went to urgent care here in our building and had a very good experience.  She did have some time spent over at [place] [facility] while they were able to get her blood pressure under control.  Since then she has had no issues and her blood pressures remain at or below goal.  She does have chronic lower extremity edema bilaterally.  She is followed by vascular once yearly for evaluation.  She does have some degree of venous insufficiency but notes that her ankles swell significantly on a regular basis.  At one point, she went to urgent care and was given furosemide for 3 days which helped with the swelling.  She wears compression stocking/socks during the winter but has difficulty tolerating them in the heat of the summer.  She does not add salt to most foods but does admit to eating out frequently. Denies CP, SOB, palpitations, dizziness, headaches, or vision changes.  Past Medical History:  Diagnosis Date   Arthritis    Hypertension    Skin cancer     Past Surgical History:  Procedure Laterality Date   ABDOMINAL HYSTERECTOMY     FACIAL COSMETIC SURGERY     "eye  lid surgery"   KNEE ARTHROSCOPY Bilateral    torn meniscus on bilateral knees   MASTECTOMY COMPLETE / SIMPLE W/ SENTINEL NODE BIOPSY Right 05/08/2017   MASTECTOMY W/ SENTINEL NODE BIOPSY Right 05/08/2017   Procedure: RIGHT MASTECTOMY WITH SENTINEL LYMPH NODE BIOPSY;  Surgeon: Jovita Kussmaul, MD;  Location: [facility];  Service: General;  Laterality: Right;   NOSE SURGERY     deviated septum    titanium ear implant Left    "for hearing" left, Dr. Thornell Mule    Family History  Problem Relation Age of Onset   Cancer Father 18       prostate cancer    Cancer Sister 66       breast cancer    Hypertension Mother     Social History   Socioeconomic History   Marital status: Married    Spouse name: Not on file   Number of children: Not on file   Years of education: Not on file   Highest education level: Not on file  Occupational History   Not on file  Tobacco Use   Smoking status: Never   Smokeless tobacco: Never  Vaping Use   Vaping Use: Never used  Substance and Sexual Activity   Alcohol use: Yes    Comment: rarely   Drug use: Never   Sexual activity: Not Currently    Partners: Male  Birth control/protection: Post-menopausal, Surgical  Other Topics Concern   Not on file  Social History Narrative   Not on file   Social Determinants of Health   Financial Resource Strain: Not on file  Food Insecurity: Not on file  Transportation Needs: Not on file  Physical Activity: Not on file  Stress: Not on file  Social Connections: Not on file  Intimate Partner Violence: Not on file    ROS Review of Systems  Constitutional:  Negative for chills, fatigue, fever and unexpected weight change.  Respiratory:  Negative for cough, chest tightness, shortness of breath and wheezing.   Cardiovascular:  Positive for leg swelling. Negative for chest pain and palpitations.  Gastrointestinal:  Negative for abdominal pain, constipation, diarrhea, nausea and vomiting.  Genitourinary:  Negative for  dysuria, frequency and urgency.  Musculoskeletal:  Positive for back pain (Right mid back radiating around ribs), joint swelling (Bilateral ankles) and myalgias.  Neurological:  Negative for dizziness, light-headedness and headaches.  Psychiatric/Behavioral:  Negative for dysphoric mood, self-injury, sleep disturbance and suicidal ideas. The patient is not nervous/anxious.    Objective:   Today's Vitals: BP 128/73   Pulse 73   Temp 98.4 F (36.9 C)   Ht 5\' 5"  (1.651 m)   Wt 191 lb 11.2 oz (87 kg)   SpO2 97%   BMI 31.90 kg/m   Physical Exam Vitals reviewed.  Constitutional:      General: She is not in acute distress.    Appearance: Normal appearance.  HENT:     Head: Normocephalic and atraumatic.  Cardiovascular:     Rate and Rhythm: Normal rate and regular rhythm.     Pulses: Normal pulses.     Heart sounds: Normal heart sounds. No murmur heard.   No friction rub. No gallop. S4 sounds: .jjsign.  Pulmonary:     Effort: Pulmonary effort is normal. No respiratory distress.     Breath sounds: Normal breath sounds. No wheezing.  Musculoskeletal:        General: Normal range of motion.     Right lower leg: 2+ Edema present.     Left lower leg: 2+ Pitting Edema present.  Skin:    General: Skin is warm and dry.  Neurological:     Mental Status: She is alert and oriented to person, place, and time.  Psychiatric:        Mood and Affect: Mood normal.        Behavior: Behavior normal.        Thought Content: Thought content normal.        Judgment: Judgment normal.    Assessment & Plan:   1. Encounter to establish care Available information and discussed healthcare concerns with patient.  2. Screening for lipid disorders Checking lipid panel today. - Lipid panel  3. Need for Tdap vaccination Tdap given in office today. - Tdap vaccine greater than or equal to 7yo IM  4. Bilateral lower extremity edema Checking CBC with differential and CMP.  Discussed using compression  stockings, elevation, and sodium restriction for first-line therapy.  Recommend staying active whenever possible as this may help.  Since furosemide was helpful, sending in furosemide 20 mg 3 times weekly as needed.  We would like to recheck her labs in about 2 weeks to make sure that her kidney function is tolerating this well and she has no electrolyte imbalances. - CBC with Differential/Platelet - COMPLETE METABOLIC PANEL WITH GFR  5. Primary hypertension Checking labs.  Blood pressure at  goal today.  Continue Benicar and HCTZ as prescribed. - CBC with Differential/Platelet - COMPLETE METABOLIC PANEL WITH GFR  6. Asymptomatic menopausal state  Updating bone density scan. - DG Bone Density; Future  Outpatient Encounter Medications as of 06/19/2021  Medication Sig   amoxicillin (AMOXIL) 500 MG capsule Take 4 capsules by mouth as needed. Prior to dental procedures   dorzolamide-timolol (COSOPT) 22.3-6.8 MG/ML ophthalmic solution Place 1 drop into both eyes 2 (two) times daily.   furosemide (LASIX) 20 MG tablet Take 20mg  three times weekly as needed for leg swelling.   latanoprost (XALATAN) 0.005 % ophthalmic solution Place 1 drop into both eyes at bedtime.   [DISCONTINUED] hydrochlorothiazide (HYDRODIURIL) 25 MG tablet Take 25 mg by mouth daily.   [DISCONTINUED] olmesartan (BENICAR) 40 MG tablet Take 40 mg by mouth daily.   hydrochlorothiazide (HYDRODIURIL) 25 MG tablet Take 1 tablet (25 mg total) by mouth daily.   olmesartan (BENICAR) 40 MG tablet Take 1 tablet (40 mg total) by mouth daily.   [DISCONTINUED] benzonatate (TESSALON) 200 MG capsule benzonatate 200 mg capsule   [DISCONTINUED] bimatoprost (LUMIGAN) 0.01 % SOLN Lumigan 0.01 % eye drops  INSTILL 1 DROP INTO BOTH EYES AT BEDTIME   [DISCONTINUED] brimonidine-timolol (COMBIGAN) 0.2-0.5 % ophthalmic solution Combigan 0.2 %-0.5 % eye drops   [DISCONTINUED] chlorpheniramine-HYDROcodone (TUSSIONEX) 10-8 MG/5ML SUER hydrocodone 10  mg-chlorpheniramine 8 mg/5 mL oral susp extend.rel 12hr   [DISCONTINUED] ciprofloxacin-dexamethasone (CIPRODEX) OTIC suspension Ciprodex 0.3 %-0.1 % ear drops,suspension  INSTILL 3 DROPS IN LEFT EAR 3 TIMES/DAY X 1 WK   [DISCONTINUED] colestipol (COLESTID) 1 g tablet colestipol 1 gram tablet   [DISCONTINUED] diclofenac sodium (VOLTAREN) 1 % GEL diclofenac 1 % topical gel   [DISCONTINUED] estradiol (MINIVELLE) 0.075 MG/24HR Minivelle 0.075 mg/24 hr transdermal patch   [DISCONTINUED] fluocinonide cream (LIDEX) 0.05 % fluocinonide 0.05 % topical cream   [DISCONTINUED] fluticasone (FLONASE) 50 MCG/ACT nasal spray fluticasone propionate 50 mcg/actuation nasal spray,suspension   [DISCONTINUED] FML FORTE 0.25 % ophthalmic suspension INSTILL 1 DROP INTO LEFT EYE AS DIRECTED   [DISCONTINUED] furosemide (LASIX) 40 MG tablet Take 1 tablet (40 mg total) by mouth daily for 5 days.   [DISCONTINUED] HYDROcodone-acetaminophen (NORCO/VICODIN) 5-325 MG tablet hydrocodone 5 mg-acetaminophen 325 mg tablet   [DISCONTINUED] ipratropium-albuterol (DUONEB) 0.5-2.5 (3) MG/3ML SOLN Take 3 mLs by nebulization every 6 (six) hours as needed.   [DISCONTINUED] ketorolac (ACULAR) 0.5 % ophthalmic solution INSTILL 1 DROP INTO RIGHT EYE FOUR TIMES A DAY STARTING 1 DAY BEFORE SURGERY   [DISCONTINUED] lisinopril-hydrochlorothiazide (ZESTORETIC) 20-25 MG tablet lisinopril 20 mg-hydrochlorothiazide 25 mg tablet   [DISCONTINUED] losartan (COZAAR) 100 MG tablet Take 100 mg by mouth daily. (Patient not taking: Reported on 05/15/2021)   [DISCONTINUED] losartan-hydrochlorothiazide (HYZAAR) 100-25 MG tablet Take 1 tablet by mouth daily before breakfast.   [DISCONTINUED] olopatadine (PATANOL) 0.1 % ophthalmic solution olopatadine 0.1 % eye drops   [DISCONTINUED] ondansetron (ZOFRAN ODT) 4 MG disintegrating tablet Take 1 tablet (4 mg total) by mouth every 8 (eight) hours as needed for nausea or vomiting.   [DISCONTINUED] oxyCODONE (OXY  IR/ROXICODONE) 5 MG immediate release tablet Take 1 tablet (5 mg total) by mouth every 6 (six) hours as needed for moderate pain, severe pain or breakthrough pain.   [DISCONTINUED] prednisoLONE acetate (PRED FORTE) 1 % ophthalmic suspension prednisolone acetate 1 % eye drops,suspension   [DISCONTINUED] ROCKLATAN 0.02-0.005 % SOLN Place 1 drop into both eyes at bedtime.   [DISCONTINUED] rosuvastatin (CRESTOR) 10 MG tablet TAKE 1 TABLET BY MOUTH ONE  TIME PER WEEK   [DISCONTINUED] timolol (BETIMOL) 0.5 % ophthalmic solution Place 1 drop into both eyes 2 (two) times daily.   [DISCONTINUED] timolol (TIMOPTIC) 0.5 % ophthalmic solution Place 1 drop into both eyes 2 (two) times daily.   [DISCONTINUED] triamcinolone cream (KENALOG) 0.1 % triamcinolone acetonide 0.1 % topical cream   No facility-administered encounter medications on file as of 06/19/2021.    Follow-up: Return in about 2 weeks (around 07/03/2021) for lab work as ordered.   Clearnce Sorrel, DNP, APRN, FNP-BC [facility]

## 2021-06-19 NOTE — Patient Instructions (Signed)
Tdap (Tetanus, Diphtheria, Pertussis) Vaccine: What You Need to Know 1. Why get vaccinated? Tdap vaccine can prevent tetanus, diphtheria, and pertussis. Diphtheria and pertussis spread from person to person. Tetanus enters the body through cuts or wounds. TETANUS (T) causes painful stiffening of the muscles. Tetanus can lead to serious health problems, including being unable to open the mouth, having trouble swallowing and breathing, or death. DIPHTHERIA (D) can lead to difficulty breathing, heart failure, paralysis, or death. PERTUSSIS (aP), also known as "whooping cough," can cause uncontrollable, violent coughing that makes it hard to breathe, eat, or drink. Pertussis can be extremely serious especially in babies and young children, causing pneumonia, convulsions, brain damage, or death. In teens and adults, it can cause weight loss, loss of bladder control, passing out, and rib fractures from severe coughing. 2. Tdap vaccine Tdap is only for children 7 years and older, adolescents, and adults.  Adolescents should receive a single dose of Tdap, preferably at age 11 or 12 years. Pregnant people should get a dose of Tdap during every pregnancy, preferably during the early part of the third trimester, to help protect the newborn from pertussis. Infants are most at risk for severe, life-threatening complications frompertussis. Adults who have never received Tdap should get a dose of Tdap. Also, adults should receive a booster dose of either Tdap or Td (a different vaccine that protects against tetanus and diphtheria but not pertussis) every 10 years, or after 5 years in the case of a severe or dirty wound or burn. Tdap may be given at the same time as other vaccines. 3. Talk with your health care provider Tell your vaccine provider if the person getting the vaccine: Has had an allergic reaction after a previous dose of any vaccine that protects against tetanus, diphtheria, or pertussis, or has any  severe, life-threatening allergies Has had a coma, decreased level of consciousness, or prolonged seizures within 7 days after a previous dose of any pertussis vaccine (DTP, DTaP, or Tdap) Has seizures or another nervous system problem Has ever had Guillain-Barr Syndrome (also called "GBS") Has had severe pain or swelling after a previous dose of any vaccine that protects against tetanus or diphtheria In some cases, your health care provider may decide to postpone Tdapvaccination until a future visit. People with minor illnesses, such as a cold, may be vaccinated. People who are moderately or severely ill should usually wait until they recover beforegetting Tdap vaccine.  Your health care provider can give you more information. 4. Risks of a vaccine reaction Pain, redness, or swelling where the shot was given, mild fever, headache, feeling tired, and nausea, vomiting, diarrhea, or stomachache sometimes happen after Tdap vaccination. People sometimes faint after medical procedures, including vaccination. Tellyour provider if you feel dizzy or have vision changes or ringing in the ears.  As with any medicine, there is a very remote chance of a vaccine causing asevere allergic reaction, other serious injury, or death. 5. What if there is a serious problem? An allergic reaction could occur after the vaccinated person leaves the clinic. If you see signs of a severe allergic reaction (hives, swelling of the face and throat, difficulty breathing, a fast heartbeat, dizziness, or weakness), call 9-1-1and get the person to the nearest hospital. For other signs that concern you, call your health care provider.  Adverse reactions should be reported to the Vaccine Adverse Event Reporting System (VAERS). Your health care provider will usually file this report, or you can do it yourself. Visit the   VAERS website at www.vaers.hhs.gov or call 1-800-822-7967. VAERS is only for reporting reactions, and VAERS staff  members do not give medical advice. 6. The National Vaccine Injury Compensation Program The National Vaccine Injury Compensation Program (VICP) is a federal program that was created to compensate people who may have been injured by certain vaccines. Claims regarding alleged injury or death due to vaccination have a time limit for filing, which may be as short as two years. Visit the VICP website at www.hrsa.gov/vaccinecompensation or call 1-800-338-2382to learn about the program and about filing a claim. 7. How can I learn more? Ask your health care provider. Call your local or state health department. Visit the website of the Food and Drug Administration (FDA) for vaccine package inserts and additional information at www.fda.gov/vaccines-blood-biologics/vaccines. Contact the Centers for Disease Control and Prevention (CDC): Call 1-800-232-4636 (1-800-CDC-INFO) or Visit CDC's website at www.cdc.gov/vaccines. Vaccine Information Statement Tdap (Tetanus, Diphtheria, Pertussis) Vaccine(07/27/2020) This information is not intended to replace advice given to you by your health care provider. Make sure you discuss any questions you have with your healthcare provider. Document Revised: 08/22/2020 Document Reviewed: 08/22/2020 Elsevier Patient Education  2022 Elsevier Inc.  

## 2021-06-25 ENCOUNTER — Encounter: Payer: Self-pay | Admitting: Medical-Surgical

## 2021-06-28 ENCOUNTER — Other Ambulatory Visit: Payer: Self-pay

## 2021-06-28 ENCOUNTER — Other Ambulatory Visit: Payer: Medicare Other

## 2021-06-28 DIAGNOSIS — Z13220 Encounter for screening for lipoid disorders: Secondary | ICD-10-CM | POA: Diagnosis not present

## 2021-06-28 DIAGNOSIS — R600 Localized edema: Secondary | ICD-10-CM | POA: Diagnosis not present

## 2021-06-28 DIAGNOSIS — I10 Essential (primary) hypertension: Secondary | ICD-10-CM | POA: Diagnosis not present

## 2021-06-29 LAB — COMPLETE METABOLIC PANEL WITH GFR
AG Ratio: 1.9 (calc) (ref 1.0–2.5)
ALT: 17 U/L (ref 6–29)
AST: 19 U/L (ref 10–35)
Albumin: 4.2 g/dL (ref 3.6–5.1)
Alkaline phosphatase (APISO): 93 U/L (ref 37–153)
BUN/Creatinine Ratio: 29 (calc) — ABNORMAL HIGH (ref 6–22)
BUN: 34 mg/dL — ABNORMAL HIGH (ref 7–25)
CO2: 27 mmol/L (ref 20–32)
Calcium: 9.9 mg/dL (ref 8.6–10.4)
Chloride: 101 mmol/L (ref 98–110)
Creat: 1.19 mg/dL — ABNORMAL HIGH (ref 0.60–0.93)
GFR, Est African American: 52 mL/min/{1.73_m2} — ABNORMAL LOW (ref >=60)
GFR, Est Non African American: 45 mL/min/{1.73_m2} — ABNORMAL LOW (ref >=60)
Globulin: 2.2 g/dL (calc) (ref 1.9–3.7)
Glucose, Bld: 87 mg/dL (ref 65–99)
Potassium: 4.4 mmol/L (ref 3.5–5.3)
Sodium: 137 mmol/L (ref 135–146)
Total Bilirubin: 0.4 mg/dL (ref 0.2–1.2)
Total Protein: 6.4 g/dL (ref 6.1–8.1)

## 2021-06-29 LAB — CBC WITH DIFFERENTIAL/PLATELET
Absolute Monocytes: 452 cells/uL (ref 200–950)
Basophils Absolute: 73 cells/uL (ref 0–200)
Basophils Relative: 1.4 %
Eosinophils Absolute: 520 cells/uL — ABNORMAL HIGH (ref 15–500)
Eosinophils Relative: 10 %
HCT: 36.7 % (ref 35.0–45.0)
Hemoglobin: 11.9 g/dL (ref 11.7–15.5)
Lymphs Abs: 1435 cells/uL (ref 850–3900)
MCH: 28.7 pg (ref 27.0–33.0)
MCHC: 32.4 g/dL (ref 32.0–36.0)
MCV: 88.4 fL (ref 80.0–100.0)
MPV: 11 fL (ref 7.5–12.5)
Monocytes Relative: 8.7 %
Neutro Abs: 2720 cells/uL (ref 1500–7800)
Neutrophils Relative %: 52.3 %
Platelets: 252 10*3/uL (ref 140–400)
RBC: 4.15 10*6/uL (ref 3.80–5.10)
RDW: 12.9 % (ref 11.0–15.0)
Total Lymphocyte: 27.6 %
WBC: 5.2 10*3/uL (ref 3.8–10.8)

## 2021-06-29 LAB — LIPID PANEL
Cholesterol: 230 mg/dL — ABNORMAL HIGH (ref <200)
HDL: 46 mg/dL — ABNORMAL LOW (ref >=50)
LDL Cholesterol (Calc): 152 mg/dL (calc) — ABNORMAL HIGH
Non-HDL Cholesterol (Calc): 184 mg/dL (calc) — ABNORMAL HIGH (ref <130)
Total CHOL/HDL Ratio: 5 (calc) — ABNORMAL HIGH (ref <5.0)
Triglycerides: 188 mg/dL — ABNORMAL HIGH (ref <150)

## 2021-07-02 ENCOUNTER — Other Ambulatory Visit: Payer: Medicare Other

## 2021-07-03 ENCOUNTER — Encounter: Payer: Self-pay | Admitting: Medical-Surgical

## 2021-07-03 MED ORDER — EZETIMIBE 10 MG PO TABS
10.0000 mg | ORAL_TABLET | Freq: Every day | ORAL | 3 refills | Status: DC
Start: 2021-07-03 — End: 2021-12-14

## 2021-07-03 NOTE — Addendum Note (Signed)
Addended bySamuel Bouche on: 07/03/2021 07:31 AM   Modules accepted: Orders

## 2021-07-10 DIAGNOSIS — H4089 Other specified glaucoma: Secondary | ICD-10-CM | POA: Diagnosis not present

## 2021-08-06 DIAGNOSIS — Z961 Presence of intraocular lens: Secondary | ICD-10-CM | POA: Diagnosis not present

## 2021-08-06 DIAGNOSIS — H0102B Squamous blepharitis left eye, upper and lower eyelids: Secondary | ICD-10-CM | POA: Diagnosis not present

## 2021-08-06 DIAGNOSIS — H401122 Primary open-angle glaucoma, left eye, moderate stage: Secondary | ICD-10-CM | POA: Diagnosis not present

## 2021-08-06 DIAGNOSIS — H0102A Squamous blepharitis right eye, upper and lower eyelids: Secondary | ICD-10-CM | POA: Diagnosis not present

## 2021-08-06 DIAGNOSIS — H26492 Other secondary cataract, left eye: Secondary | ICD-10-CM | POA: Diagnosis not present

## 2021-08-06 DIAGNOSIS — H401113 Primary open-angle glaucoma, right eye, severe stage: Secondary | ICD-10-CM | POA: Diagnosis not present

## 2021-08-06 DIAGNOSIS — H43812 Vitreous degeneration, left eye: Secondary | ICD-10-CM | POA: Diagnosis not present

## 2021-09-02 DIAGNOSIS — R059 Cough, unspecified: Secondary | ICD-10-CM | POA: Diagnosis not present

## 2021-09-02 DIAGNOSIS — J40 Bronchitis, not specified as acute or chronic: Secondary | ICD-10-CM | POA: Diagnosis not present

## 2021-09-23 ENCOUNTER — Other Ambulatory Visit: Payer: Self-pay

## 2021-09-23 ENCOUNTER — Ambulatory Visit (HOSPITAL_BASED_OUTPATIENT_CLINIC_OR_DEPARTMENT_OTHER)
Admission: RE | Admit: 2021-09-23 | Discharge: 2021-09-23 | Disposition: A | Discharge status: Home / Self Care | Payer: Medicare Other | Source: Ambulatory Visit | Attending: Medical-Surgical | Admitting: Medical-Surgical

## 2021-09-23 DIAGNOSIS — Z780 Asymptomatic menopausal state: Secondary | ICD-10-CM | POA: Insufficient documentation

## 2021-09-23 DIAGNOSIS — M85851 Other specified disorders of bone density and structure, right thigh: Secondary | ICD-10-CM | POA: Diagnosis not present

## 2021-10-09 DIAGNOSIS — D0511 Intraductal carcinoma in situ of right breast: Secondary | ICD-10-CM | POA: Diagnosis not present

## 2021-10-14 DIAGNOSIS — E7800 Pure hypercholesterolemia, unspecified: Secondary | ICD-10-CM | POA: Diagnosis not present

## 2021-10-14 DIAGNOSIS — Z0000 Encounter for general adult medical examination without abnormal findings: Secondary | ICD-10-CM | POA: Diagnosis not present

## 2021-10-14 DIAGNOSIS — N1831 Chronic kidney disease, stage 3a: Secondary | ICD-10-CM | POA: Diagnosis not present

## 2021-10-14 DIAGNOSIS — I10 Essential (primary) hypertension: Secondary | ICD-10-CM | POA: Diagnosis not present

## 2021-11-01 DIAGNOSIS — L821 Other seborrheic keratosis: Secondary | ICD-10-CM | POA: Diagnosis not present

## 2021-11-01 DIAGNOSIS — Z86006 Personal history of melanoma in-situ: Secondary | ICD-10-CM | POA: Diagnosis not present

## 2021-11-01 DIAGNOSIS — D0471 Carcinoma in situ of skin of right lower limb, including hip: Secondary | ICD-10-CM | POA: Diagnosis not present

## 2021-11-01 DIAGNOSIS — Z808 Family history of malignant neoplasm of other organs or systems: Secondary | ICD-10-CM | POA: Diagnosis not present

## 2021-11-01 DIAGNOSIS — L570 Actinic keratosis: Secondary | ICD-10-CM | POA: Diagnosis not present

## 2021-11-01 DIAGNOSIS — Z23 Encounter for immunization: Secondary | ICD-10-CM | POA: Diagnosis not present

## 2021-11-01 DIAGNOSIS — D2272 Melanocytic nevi of left lower limb, including hip: Secondary | ICD-10-CM | POA: Diagnosis not present

## 2021-11-01 DIAGNOSIS — L578 Other skin changes due to chronic exposure to nonionizing radiation: Secondary | ICD-10-CM | POA: Diagnosis not present

## 2021-11-01 DIAGNOSIS — Z85828 Personal history of other malignant neoplasm of skin: Secondary | ICD-10-CM | POA: Diagnosis not present

## 2021-11-01 DIAGNOSIS — L723 Sebaceous cyst: Secondary | ICD-10-CM | POA: Diagnosis not present

## 2021-11-01 DIAGNOSIS — Z86018 Personal history of other benign neoplasm: Secondary | ICD-10-CM | POA: Diagnosis not present

## 2021-11-01 DIAGNOSIS — D485 Neoplasm of uncertain behavior of skin: Secondary | ICD-10-CM | POA: Diagnosis not present

## 2021-11-20 DIAGNOSIS — H26493 Other secondary cataract, bilateral: Secondary | ICD-10-CM | POA: Diagnosis not present

## 2021-11-20 DIAGNOSIS — H4089 Other specified glaucoma: Secondary | ICD-10-CM | POA: Diagnosis not present

## 2021-12-05 DIAGNOSIS — D0471 Carcinoma in situ of skin of right lower limb, including hip: Secondary | ICD-10-CM | POA: Diagnosis not present

## 2021-12-14 ENCOUNTER — Emergency Department
Admission: EM | Admit: 2021-12-14 | Discharge: 2021-12-14 | Disposition: A | Discharge status: Home / Self Care | Payer: Medicare Other | Source: Home / Self Care

## 2021-12-14 ENCOUNTER — Other Ambulatory Visit: Payer: Self-pay

## 2021-12-14 DIAGNOSIS — J209 Acute bronchitis, unspecified: Secondary | ICD-10-CM

## 2021-12-14 HISTORY — DX: Bronchitis, not specified as acute or chronic: J40

## 2021-12-14 MED ORDER — AZITHROMYCIN 250 MG PO TABS: ORAL_TABLET | ORAL | 0 refills | Status: DC

## 2021-12-14 MED ORDER — HYDROCOD POLST-CPM POLST ER 10-8 MG/5ML PO SUER: 5.0000 mL | Freq: Two times a day (BID) | ORAL | 0 refills | Status: DC | PRN

## 2021-12-14 NOTE — ED Triage Notes (Signed)
Pt states that she has a cough and some chest congestion.  Pt states that she typically gets bronchitis.   Pt states that she isn't vaccinated for covid. Pt states that she hasn't had flu vaccine.

## 2021-12-14 NOTE — Discharge Instructions (Addendum)
Take the mucinex DM as directed Add Tussionex as needed atbedtime Take the z pak as instructed, two today then one a day until gone Drink lots of water

## 2021-12-24 ENCOUNTER — Telehealth (INDEPENDENT_AMBULATORY_CARE_PROVIDER_SITE_OTHER): Payer: Medicare Other | Admitting: Medical-Surgical

## 2021-12-24 ENCOUNTER — Encounter: Payer: Self-pay | Admitting: Medical-Surgical

## 2021-12-24 DIAGNOSIS — J209 Acute bronchitis, unspecified: Secondary | ICD-10-CM

## 2021-12-24 MED ORDER — BENZONATATE 200 MG PO CAPS: 200.0000 mg | ORAL_CAPSULE | Freq: Three times a day (TID) | ORAL | 0 refills | Status: DC | PRN

## 2021-12-24 NOTE — Telephone Encounter (Signed)
Pt scheduled for 4:20pm today.  Charyl Bigger, CMA

## 2021-12-24 NOTE — Progress Notes (Signed)
Virtual Visit via Telephone   I connected with  Amy [name]  on 12/24/21 by telephone/telehealth and verified that I am speaking with the correct person using two identifiers.   I discussed the limitations, risks, security and privacy concerns of performing an evaluation and management service by telephone, including the higher likelihood of inaccurate diagnosis and treatment, and the availability of in person appointments.  We also discussed the likely need of an additional face to face encounter for complete and high quality delivery of care.  I also discussed with the patient that there may be a patient responsible charge related to this service. The patient expressed understanding and wishes to proceed.  Provider location is in medical facility. Patient location is at their home, different from provider location. People involved in care of the patient during this telehealth encounter were myself, my nurse/medical assistant, and my front office/scheduling team member.  CC: bronchitis follow up  HPI: Very pleasant 76 year old female presenting via telephone for follow-up on recent episode of bronchitis.  She was evaluated urgent care on 12/14/2021 and diagnosed with acute bronchitis.  She was treated with azithromycin as well as Tussionex cough syrup.  Notes the Tussionex has been helpful to control her cough at night and she is sleeping fairly well.  Unfortunately, she still has quite a bit of congestion and continues to cough uncontrollably.  She is going out of town tomorrow and is interested in anything that may help clear up some of her congestion and manage her cough.  Reports a history of intolerance to prednisone although so this is not listed in her allergies or contraindications.  She is unaware of what her symptoms were to indicate intolerance.  Has also been intolerant of albuterol due to severe side effects of jitteriness and tachycardia.  She does have a nebulizer at home along  with DuoNeb solution but has not tried this so far.  Review of Systems: See HPI for pertinent positives and negatives.   Objective Findings:    General: Speaking full sentences, no audible heavy breathing.  Sounds alert and appropriately interactive.    Impression and Recommendations:    1. Acute bronchitis, unspecified organism She has already completed azithromycin is no indication to retreat with antibiotics at this point.  Unfortunately, this may last up to a couple of weeks if not a bit longer before full resolution.  Okay to use Tussionex every 12 hours but monitor for sedation.  Sending in [place] to help manage cough.  Recommend using DuoNeb nebulizer treatments to see if this will be beneficial.  Unable to tolerate oral steroids so we will hold off on this.  May benefit from an inhaled corticosteroid if conservative management is unhelpful.  Reviewed measures to help resolve her cough including staying well-hydrated and aiming for warm fluids such as tea with honey, coffee, or hot chocolate.  I discussed the above assessment and treatment plan with the patient. The patient was provided an opportunity to ask questions and all were answered. The patient agreed with the plan and demonstrated an understanding of the instructions.   The patient was advised to call back or seek an in-person evaluation if the symptoms worsen or if the condition fails to improve as anticipated.  20 minutes of non-face-to-face time was provided during this encounter.  Return if symptoms worsen or fail to improve. ___________________________________________ Samuel Bouche, DNP, APRN, FNP-BC [facility] [place]

## 2021-12-24 NOTE — Telephone Encounter (Signed)
Can we add her on this afternoon around 4:20pm as a telephone visit?

## 2021-12-28 ENCOUNTER — Encounter: Payer: Self-pay | Admitting: Medical-Surgical

## 2021-12-30 ENCOUNTER — Other Ambulatory Visit: Payer: Self-pay | Admitting: Medical-Surgical

## 2021-12-30 DIAGNOSIS — J411 Mucopurulent chronic bronchitis: Secondary | ICD-10-CM

## 2022-01-10 ENCOUNTER — Ambulatory Visit (INDEPENDENT_AMBULATORY_CARE_PROVIDER_SITE_OTHER): Payer: Medicare Other | Admitting: Medical-Surgical

## 2022-01-10 DIAGNOSIS — Z0000 Encounter for general adult medical examination without abnormal findings: Secondary | ICD-10-CM | POA: Diagnosis not present

## 2022-01-10 NOTE — Progress Notes (Signed)
MEDICARE ANNUAL WELLNESS VISIT  01/10/2022  Telephone Visit Disclaimer This Medicare AWV was conducted by telephone due to national recommendations for restrictions regarding the COVID-19 Pandemic (e.g. social distancing).  I verified, using two identifiers, that I am speaking with Amy [name] or their authorized healthcare agent. I discussed the limitations, risks, security, and privacy concerns of performing an evaluation and management service by telephone and the potential availability of an in-person appointment in the future. The patient expressed understanding and agreed to proceed.  Location of Patient: Home Location of Provider (nurse):  In the office.  Subjective:    Amy [name] is a 76 y.o. female patient of Samuel Bouche, NP who had a Medicare Annual Wellness Visit today via telephone. Amy [name] is Retired and lives with their spouse. she has 4 children. she reports that she is socially active and does interact with friends/family regularly. she is minimally physically active and enjoys antique shopping.  Patient Care Team: Samuel Bouche, NP as PCP - General (Nurse Practitioner) Jovita Kussmaul, MD as Consulting Physician (General Surgery)  Advanced Directives 01/10/2022 06/19/2021 07/10/2017 05/08/2017 04/27/2017  Does Patient Have a Medical Advance Directive? Yes Yes Yes Yes No  Type of Paramedic of [place];Living will [place];Living will;Out of facility DNR (pink MOST or yellow form) - [place];Living will -  Does patient want to make changes to medical advance directive? No - Patient declined No - Patient declined - No - Patient declined -  Copy of [place] in Chart? Yes - validated most recent copy scanned in chart (See row information) No - copy requested - Yes -  Would patient like information on creating a medical advance directive? - - - - Yes (MAU/Ambulatory/Procedural Areas -  Information given)    Hospital Utilization Over the Past 12 Months: # of hospitalizations or ER visits: 0 # of surgeries: 0  Review of Systems    Patient reports that her overall health is unchanged compared to last year.  History obtained from chart review and the patient  Patient Reported Readings (BP, Pulse, CBG, Weight, etc) none  Pain Assessment Pain : No/denies pain     Current Medications & Allergies (verified) Allergies as of 01/10/2022       Reactions   Meperidine Anaphylaxis   Gabapentin Other (See Comments)   Aphasia    Rosuvastatin Other (See Comments)   Myalgias   Tape    Adhesive tape::bruising   Tapentadol Other (See Comments)   Adhesive tape::bruising   Methylprednisolone Anxiety, Swelling        Medication List        Accurate as of January 10, 2022  9:03 AM. If you have any questions, ask your nurse or doctor.          amoxicillin 500 MG capsule Commonly known as: AMOXIL Take 4 capsules by mouth as needed. Prior to dental procedures   azithromycin 250 MG tablet Commonly known as: Zithromax Z-Pak TAKE TWO TABS TODAY THEN ONE A DAY UNTIL COMPLETE   benzonatate 200 MG capsule Commonly known as: TESSALON Take 1 capsule (200 mg total) by mouth 3 (three) times daily as needed for cough.   BIAFINE EX   chlorpheniramine-HYDROcodone 10-8 MG/5ML Suer Commonly known as: Tussionex Pennkinetic ER Take 5 mLs by mouth every 12 (twelve) hours as needed for cough.   DORZOLAMIDE HCL OP   dorzolamide-timolol 22.3-6.8 MG/ML ophthalmic solution Commonly known as: COSOPT Place 1  drop into both eyes 2 (two) times daily.   furosemide 20 MG tablet Commonly known as: LASIX Take 20mg  three times weekly as needed for leg swelling.   hydrochlorothiazide 25 MG tablet Commonly known as: HYDRODIURIL Take 1 tablet (25 mg total) by mouth daily.   ipratropium-albuterol 0.5-2.5 (3) MG/3ML Soln Commonly known as: DUONEB SMARTSIG:3 Milliliter(s) Via  Nebulizer Every 6 Hours PRN   latanoprost 0.005 % ophthalmic solution Commonly known as: XALATAN Place 1 drop into both eyes at bedtime.   olmesartan 40 MG tablet Commonly known as: BENICAR Take 1 tablet (40 mg total) by mouth daily.        History (reviewed): Past Medical History:  Diagnosis Date   Allergy 1978   Face swelling   Arthritis    Breast cancer ([place])    Bronchitis    Glaucoma Several years   Severe   Hypertension    Skin cancer    Past Surgical History:  Procedure Laterality Date   ABDOMINAL HYSTERECTOMY     APPENDECTOMY     BREAST SURGERY  April 2018   CESAREAN SECTION     COSMETIC SURGERY  2017   EYE SURGERY  09-2016/01-2017   Glaucoma/Cataract surgery   FACIAL COSMETIC SURGERY     "eye lid surgery"   GLAUCOMA SURGERY     JOINT REPLACEMENT     KNEE ARTHROSCOPY Bilateral    torn meniscus on bilateral knees   MASTECTOMY Right    MASTECTOMY COMPLETE / SIMPLE W/ SENTINEL NODE BIOPSY Right 05/08/2017   MASTECTOMY W/ SENTINEL NODE BIOPSY Right 05/08/2017   Procedure: RIGHT MASTECTOMY WITH SENTINEL LYMPH NODE BIOPSY;  Surgeon: Jovita Kussmaul, MD;  Location: [facility];  Service: General;  Laterality: Right;   NOSE SURGERY     deviated septum    titanium ear implant Left    "for hearing" left, Dr. Thornell Mule   TUBAL LIGATION     Family History  Problem Relation Age of Onset   Hypertension Mother    Skin cancer Father    Prostate cancer Father    Cancer Father    Breast cancer Sister    Cancer Sister    Social History   Socioeconomic History   Marital status: Married    Spouse name: Ger Nicks   Number of children: 4   Years of education: 12   Highest education level: 12th grade  Occupational History   Occupation: Retired  Tobacco Use   Smoking status: Never   Smokeless tobacco: Never  Vaping Use   Vaping Use: Never used  Substance and Sexual Activity   Alcohol use: Yes    Alcohol/week: 1.0 standard drink    Types: 1 Glasses of wine per  week    Comment: Very little alcohol use   Drug use: No   Sexual activity: Not Currently    Partners: Male    Birth control/protection: None  Other Topics Concern   Not on file  Social History Narrative   Lives with husband. She has four children. She enjoys going to antique doors and stays active.   Social Determinants of Health   Financial Resource Strain: Low Risk    Difficulty of Paying Living Expenses: Not hard at all  Food Insecurity: No Food Insecurity   Worried About Charity fundraiser in the Last Year: Never true   [place] in the Last Year: Never true  Transportation Needs: No Transportation Needs   Lack of Transportation (Medical): No  Lack of Transportation (Non-Medical): No  Physical Activity: Inactive   Days of Exercise per Week: 0 days   Minutes of Exercise per Session: 0 min  Stress: No Stress Concern Present   Feeling of Stress : Not at all  Social Connections: Moderately Integrated   Frequency of Communication with Friends and Family: More than three times a week   Frequency of Social Gatherings with Friends and Family: More than three times a week   Attends Religious Services: More than 4 times per year   Active Member of [employer] or Organizations: No   Attends Archivist Meetings: Never   Marital Status: Married    Activities of Daily Living In your present state of health, do you have any difficulty performing the following activities: 01/10/2022 01/06/2022  Hearing? N N  Vision? N N  Difficulty concentrating or making decisions? N N  Walking or climbing stairs? Y Y  Comment due to neuropathy. -  Dressing or bathing? N N  Doing errands, shopping? N N  Preparing Food and eating ? N N  Using the Toilet? N N  In the past six months, have you accidently leaked urine? N N  Do you have problems with loss of bowel control? N N  Managing your Medications? N N  Managing your Finances? N N  Housekeeping or managing your Housekeeping? N N   Some recent data might be hidden    Patient Education/ Literacy How often do you need to have someone help you when you read instructions, pamphlets, or other written materials from your doctor or pharmacy?: 1 - Never What is the last grade level you completed in school?: 12th grade  Exercise Current Exercise Habits: The patient does not participate in regular exercise at present (She stays active.), Exercise limited by: orthopedic condition(s);neurologic condition(s) (neuropathy and knee pain.)  Diet Patient reports consuming 2 meals a day and 1 snack(s) a day Patient reports that her primary diet is: Regular Patient reports that she does have regular access to food.   Depression Screen PHQ 2/9 Scores 01/10/2022 06/19/2021  PHQ - 2 Score 0 0  PHQ- 9 Score - 0     Fall Risk Fall Risk  01/10/2022 01/06/2022 06/19/2021 06/19/2021  Falls in the past year? 0 0 0 0  Number falls in past yr: 0 - 0 0  Injury with Fall? 0 - 0 0  Risk for fall due to : No Fall Risks - - -  Follow up Falls evaluation completed - Falls evaluation completed Falls evaluation completed     Objective:  Amy [name] seemed alert and oriented and she participated appropriately during our telephone visit.  Blood Pressure Weight BMI  BP Readings from Last 3 Encounters:  12/14/21 (!) 173/93  06/19/21 128/73  05/15/21 127/60   Wt Readings from Last 3 Encounters:  12/14/21 198 lb (89.8 kg)  06/19/21 191 lb 11.2 oz (87 kg)  05/15/21 191 lb (86.6 kg)   BMI Readings from Last 1 Encounters:  12/14/21 30.11 kg/m    *Unable to obtain current vital signs, weight, and BMI due to telephone visit type  Hearing/Vision  Amy [name] did not seem to have difficulty with hearing/understanding during the telephone conversation Reports that she has had a formal eye exam by an eye care professional within the past year Reports that she has had a formal hearing evaluation within the past year *Unable to fully assess hearing  and vision during telephone visit type  Cognitive Function: 6CIT  Screen 01/10/2022  What Year? 0 points  What month? 0 points  What time? 0 points  Count back from 20 0 points  Months in reverse 0 points  Repeat phrase 0 points  Total Score 0   (Normal:0-7, Significant for Dysfunction: >8)  Normal Cognitive Function Screening: Yes   Immunization & Health Maintenance Record Immunization History  Administered Date(s) Administered   Pneumococcal Conjugate-13 03/23/2015, 04/12/2015   Pneumococcal Polysaccharide-23 04/04/2011, 04/04/2011, 03/23/2015, 03/23/2015   Pneumococcal-Unspecified 03/22/2012   Td 03/01/1999   Tdap 03/23/2011, 04/04/2011, 06/19/2021    Health Maintenance  Topic Date Due   COVID-19 Vaccine (1) 01/26/2022 (Originally 06/09/1947)   INFLUENZA VACCINE  03/21/2022 (Originally 07/22/2021)   Zoster Vaccines- Shingrix (1 of 2) 04/10/2022 (Originally 12/09/1965)   Hepatitis C Screening  01/10/2023 (Originally 12/10/1964)   COLONOSCOPY (Pts 45-21yrs Insurance coverage will need to be confirmed)  01/02/2028   TETANUS/TDAP  06/20/2031   Pneumonia Vaccine 38+ Years old  Completed   DEXA SCAN  Completed   HPV VACCINES  Aged Out       Assessment  This is a routine wellness examination for Amy [name].  Health Maintenance: Due or Overdue There are no preventive care reminders to display for this patient.   Amy [name] does not need a referral for Community Assistance: Care Management:   no Social Work:    no Prescription Assistance:  no Nutrition/Diabetes Education:  no   Plan:  Personalized Goals  Goals Addressed               This Visit's Progress     Patient Stated (pt-stated)        01/10/2022 AWV Goal: Exercise for General Health  Patient will verbalize understanding of the benefits of increased physical activity: Exercising regularly is important. It will improve your overall fitness, flexibility, and endurance. Regular exercise  also will improve your overall health. It can help you control your weight, reduce stress, and improve your bone density. Over the next year, patient will increase physical activity as tolerated with a goal of at least 150 minutes of moderate physical activity per week.  You can tell that you are exercising at a moderate intensity if your heart starts beating faster and you start breathing faster but can still hold a conversation. Moderate-intensity exercise ideas include: Walking 1 mile (1.6 km) in about 15 minutes Biking Hiking Golfing Dancing Water aerobics Patient will verbalize understanding of everyday activities that increase physical activity by providing examples like the following: Yard work, such as: Sales promotion account executive Gardening Washing windows or floors Patient will be able to explain general safety guidelines for exercising:  Before you start a new exercise program, talk with your health care provider. Do not exercise so much that you hurt yourself, feel dizzy, or get very short of breath. Wear comfortable clothes and wear shoes with good support. Drink plenty of water while you exercise to prevent dehydration or heat stroke. Work out until your breathing and your heartbeat get faster.        Personalized Health Maintenance & Screening Recommendations  Influenza vaccine Shingrix  Patient declined the immunizations.  Lung Cancer Screening Recommended: no (Low Dose CT Chest recommended if Age 1-80 years, 30 pack-year currently smoking OR have quit w/in past 15 years) Hepatitis C Screening recommended: yes HIV Screening recommended: no  Advanced Directives: Written information was not prepared per patient's request.  Referrals &  Orders No orders of the defined types were placed in this encounter.  Follow-up Plan Follow-up with Samuel Bouche, NP as planned Medicare wellness visit in  one year. Patient states she will give the AVS on mychart.   I have personally reviewed and noted the following in the patients chart:   Medical and social history Use of alcohol, tobacco or illicit drugs  Current medications and supplements Functional ability and status Nutritional status Physical activity Advanced directives List of other physicians Hospitalizations, surgeries, and ER visits in previous 12 months Vitals Screenings to include cognitive, depression, and falls Referrals and appointments  In addition, I have reviewed and discussed with Amy [name] certain preventive protocols, quality metrics, and best practice recommendations. A written personalized care plan for preventive services as well as general preventive health recommendations is available and can be mailed to the patient at her request.      Pieter Partridge  01/10/2022

## 2022-01-10 NOTE — Patient Instructions (Addendum)
[facility] Maintenance Summary and Written Plan of Care  [name] ,  Thank you for allowing me to perform your Medicare Annual Wellness Visit and for your ongoing commitment to your health.   Health Maintenance & Immunization History Health Maintenance  Topic Date Due   COVID-19 Vaccine (1) 01/26/2022 (Originally 06/09/1947)   INFLUENZA VACCINE  03/21/2022 (Originally 07/22/2021)   Zoster Vaccines- Shingrix (1 of 2) 04/10/2022 (Originally 12/09/1965)   Hepatitis C Screening  01/10/2023 (Originally 12/10/1964)   COLONOSCOPY (Pts 45-65yrs Insurance coverage will need to be confirmed)  01/02/2028   TETANUS/TDAP  06/20/2031   Pneumonia Vaccine 59+ Years old  Completed   DEXA SCAN  Completed   HPV VACCINES  Aged Out   Immunization History  Administered Date(s) Administered   Pneumococcal Conjugate-13 03/23/2015, 04/12/2015   Pneumococcal Polysaccharide-23 04/04/2011, 04/04/2011, 03/23/2015, 03/23/2015   Pneumococcal-Unspecified 03/22/2012   Td 03/01/1999   Tdap 03/23/2011, 04/04/2011, 06/19/2021    These are the patient goals that we discussed:  Goals Addressed               This Visit's Progress     Patient Stated (pt-stated)        01/10/2022 AWV Goal: Exercise for General Health  Patient will verbalize understanding of the benefits of increased physical activity: Exercising regularly is important. It will improve your overall fitness, flexibility, and endurance. Regular exercise also will improve your overall health. It can help you control your weight, reduce stress, and improve your bone density. Over the next year, patient will increase physical activity as tolerated with a goal of at least 150 minutes of moderate physical activity per week.  You can tell that you are exercising at a moderate intensity if your heart starts beating faster and you start breathing faster but can still hold a conversation. Moderate-intensity exercise ideas  include: Walking 1 mile (1.6 km) in about 15 minutes Biking Hiking Golfing Dancing Water aerobics Patient will verbalize understanding of everyday activities that increase physical activity by providing examples like the following: Yard work, such as: Sales promotion account executive Gardening Washing windows or floors Patient will be able to explain general safety guidelines for exercising:  Before you start a new exercise program, talk with your health care provider. Do not exercise so much that you hurt yourself, feel dizzy, or get very short of breath. Wear comfortable clothes and wear shoes with good support. Drink plenty of water while you exercise to prevent dehydration or heat stroke. Work out until your breathing and your heartbeat get faster.          This is a list of Health Maintenance Items that are overdue or due now: Influenza vaccine Shingrix  Patient declined the immunizations.   Orders/Referrals Placed Today: No orders of the defined types were placed in this encounter.  (Contact our referral department at [phone] if you have not spoken with someone about your referral appointment within the next 5 days)    Follow-up Plan Follow-up with Samuel Bouche, NP as planned Medicare wellness visit in one year. Patient states she will give the AVS on mychart.    Health Maintenance, Female Adopting a healthy lifestyle and getting preventive care are important in promoting health and wellness. Ask your health care provider about: The right schedule for you to have regular tests and exams. Things you can do on your own to prevent diseases and keep yourself  healthy. What should I know about diet, weight, and exercise? Eat a healthy diet  Eat a diet that includes plenty of vegetables, fruits, low-fat dairy products, and lean protein. Do not eat a lot of foods that are high in solid fats, added  sugars, or sodium. Maintain a healthy weight Body mass index (BMI) is used to identify weight problems. It estimates body fat based on height and weight. Your health care provider can help determine your BMI and help you achieve or maintain a healthy weight. Get regular exercise Get regular exercise. This is one of the most important things you can do for your health. Most adults should: Exercise for at least 150 minutes each week. The exercise should increase your heart rate and make you sweat (moderate-intensity exercise). Do strengthening exercises at least twice a week. This is in addition to the moderate-intensity exercise. Spend less time sitting. Even light physical activity can be beneficial. Watch cholesterol and blood lipids Have your blood tested for lipids and cholesterol at 77 years of age, then have this test every 5 years. Have your cholesterol levels checked more often if: Your lipid or cholesterol levels are high. You are older than 77 years of age. You are at high risk for heart disease. What should I know about cancer screening? Depending on your health history and family history, you may need to have cancer screening at various ages. This may include screening for: Breast cancer. Cervical cancer. Colorectal cancer. Skin cancer. Lung cancer. What should I know about heart disease, diabetes, and high blood pressure? Blood pressure and heart disease High blood pressure causes heart disease and increases the risk of stroke. This is more likely to develop in people who have high blood pressure readings or are overweight. Have your blood pressure checked: Every 3-5 years if you are 43-94 years of age. Every year if you are 56 years old or older. Diabetes Have regular diabetes screenings. This checks your fasting blood sugar level. Have the screening done: Once every three years after age 34 if you are at a normal weight and have a low risk for diabetes. More often and at a  younger age if you are overweight or have a high risk for diabetes. What should I know about preventing infection? Hepatitis B If you have a higher risk for hepatitis B, you should be screened for this virus. Talk with your health care provider to find out if you are at risk for hepatitis B infection. Hepatitis C Testing is recommended for: Everyone born from [birth year] through 1966. Anyone with known risk factors for hepatitis C. Sexually transmitted infections (STIs) Get screened for STIs, including gonorrhea and chlamydia, if: You are sexually active and are younger than 77 years of age. You are older than 77 years of age and your health care provider tells you that you are at risk for this type of infection. Your sexual activity has changed since you were last screened, and you are at increased risk for chlamydia or gonorrhea. Ask your health care provider if you are at risk. Ask your health care provider about whether you are at high risk for HIV. Your health care provider may recommend a prescription medicine to help prevent HIV infection. If you choose to take medicine to prevent HIV, you should first get tested for HIV. You should then be tested every 3 months for as long as you are taking the medicine. Pregnancy If you are about to stop having your period (premenopausal) and you  may become pregnant, seek counseling before you get pregnant. Take 400 to 800 micrograms (mcg) of folic acid every day if you become pregnant. Ask for birth control (contraception) if you want to prevent pregnancy. Osteoporosis and menopause Osteoporosis is a disease in which the bones lose minerals and strength with aging. This can result in bone fractures. If you are 93 years old or older, or if you are at risk for osteoporosis and fractures, ask your health care provider if you should: Be screened for bone loss. Take a calcium or vitamin D supplement to lower your risk of fractures. Be given hormone replacement  therapy (HRT) to treat symptoms of menopause. Follow these instructions at home: Alcohol use Do not drink alcohol if: Your health care provider tells you not to drink. You are pregnant, may be pregnant, or are planning to become pregnant. If you drink alcohol: Limit how much you have to: 0-1 drink a day. Know how much alcohol is in your drink. In the [place] one drink equals one 12 oz bottle of beer (355 mL), one 5 oz glass of wine (148 mL), or one 1 oz glass of hard liquor (44 mL). Lifestyle Do not use any products that contain nicotine or tobacco. These products include cigarettes, chewing tobacco, and vaping devices, such as e-cigarettes. If you need help quitting, ask your health care provider. Do not use street drugs. Do not share needles. Ask your health care provider for help if you need support or information about quitting drugs. General instructions Schedule regular health, dental, and eye exams. Stay current with your vaccines. Tell your health care provider if: You often feel depressed. You have ever been abused or do not feel safe at home. Summary Adopting a healthy lifestyle and getting preventive care are important in promoting health and wellness. Follow your health care provider's instructions about healthy diet, exercising, and getting tested or screened for diseases. Follow your health care provider's instructions on monitoring your cholesterol and blood pressure. This information is not intended to replace advice given to you by your health care provider. Make sure you discuss any questions you have with your health care provider. Document Revised: 04/29/2021 Document Reviewed: 04/29/2021 Elsevier Patient Education  [employer].

## 2022-01-14 ENCOUNTER — Other Ambulatory Visit: Payer: Self-pay | Admitting: Medical-Surgical

## 2022-01-14 NOTE — Telephone Encounter (Signed)
This encounter was created in error - please disregard.

## 2022-01-22 ENCOUNTER — Encounter: Payer: Self-pay | Admitting: Medical-Surgical

## 2022-01-22 ENCOUNTER — Ambulatory Visit (INDEPENDENT_AMBULATORY_CARE_PROVIDER_SITE_OTHER): Payer: Medicare Other | Admitting: Medical-Surgical

## 2022-01-22 ENCOUNTER — Other Ambulatory Visit: Payer: Self-pay

## 2022-01-22 VITALS — BP 148/74 | HR 69 | Resp 20 | Ht 68.0 in | Wt 199.0 lb

## 2022-01-22 DIAGNOSIS — M10072 Idiopathic gout, left ankle and foot: Secondary | ICD-10-CM | POA: Diagnosis not present

## 2022-01-22 MED ORDER — COLCHICINE 0.6 MG PO TABS
ORAL_TABLET | ORAL | 1 refills | Status: DC
Start: 2022-01-22 — End: 2022-07-30

## 2022-01-22 NOTE — Patient Instructions (Signed)
Gout ?Gout is painful swelling of your joints. Gout is a type of arthritis. It is caused by having too much uric acid in your body. Uric acid is a chemical that is made when your body breaks down substances called purines. If your body has too much uric acid, sharp crystals can form and build up in your joints. This causes pain and swelling. ?Gout attacks can happen quickly and be very painful (acute gout). Over time, the attacks can affect more joints and happen more often (chronic gout). ?What are the causes? ?Too much uric acid in your blood. This can happen because: ?Your kidneys do not remove enough uric acid from your blood. ?Your body makes too much uric acid. ?You eat too many foods that are high in purines. These foods include organ meats, some seafood, and beer. ?Trauma or stress. ?What increases the risk? ?Having a family history of gout. ?Being female and middle-aged. ?Being female and having gone through menopause. ?Being very overweight (obese). ?Drinking alcohol, especially beer. ?Not having enough water in the body (being dehydrated). ?Losing weight too quickly. ?Having an organ transplant. ?Having lead poisoning. ?Taking certain medicines. ?Having kidney disease. ?Having a skin condition called psoriasis. ?What are the signs or symptoms? ?An attack of acute gout usually happens in just one joint. The most common place is the big toe. Attacks often start at night. Other joints that may be affected include joints of the feet, ankle, knee, fingers, wrist, or elbow. Symptoms of an attack may include: ?Very bad pain. ?Warmth. ?Swelling. ?Stiffness. ?Shiny, red, or purple skin. ?Tenderness. The affected joint may be very painful to touch. ?Chills and fever. ?Chronic gout may cause symptoms more often. More joints may be involved. You may also have white or yellow lumps (tophi) on your hands or feet or in other areas near your joints. ?How is this treated? ?Treatment for this condition has two phases:  treating an acute attack and preventing future attacks. ?Acute gout treatment may include: ?NSAIDs. ?Steroids. These are taken by mouth or injected into a joint. ?Colchicine. This medicine relieves pain and swelling. It can be given by mouth or through an IV tube. ?Preventive treatment may include: ?Taking small doses of NSAIDs or colchicine daily. ?Using a medicine that reduces uric acid levels in your blood. ?Making changes to your diet. You may need to see a food expert (dietitian) about what to eat and drink to prevent gout. ?Follow these instructions at home: ?During a gout attack ? ?If told, put ice on the painful area: ?Put ice in a plastic bag. ?Place a towel between your skin and the bag. ?Leave the ice on for 20 minutes, 2-3 times a day. ?Raise (elevate) the painful joint above the level of your heart as often as you can. ?Rest the joint as much as possible. If the joint is in your leg, you may be given crutches. ?Follow instructions from your doctor about what you cannot eat or drink. ?Avoiding future gout attacks ?Eat a low-purine diet. Avoid foods and drinks such as: ?Liver. ?Kidney. ?Anchovies. ?Asparagus. ?Herring. ?Mushrooms. ?Mussels. ?Beer. ?Stay at a healthy weight. If you want to lose weight, talk with your doctor. Do not lose weight too fast. ?Start or continue an exercise plan as told by your doctor. ?Eating and drinking ?Drink enough fluids to keep your pee (urine) pale yellow. ?If you drink alcohol: ?Limit how much you use to: ?0-1 drink a day for women. ?0-2 drinks a day for men. ?Be aware of   how much alcohol is in your drink. In the [place] one drink equals one 12 oz bottle of beer (355 mL), one 5 oz glass of wine (148 mL), or one 1? oz glass of hard liquor (44 mL). ?General instructions ?Take over-the-counter and prescription medicines only as told by your doctor. ?Do not drive or use heavy machinery while taking prescription pain medicine. ?Return to your normal activities as told by your  doctor. Ask your doctor what activities are safe for you. ?Keep all follow-up visits as told by your doctor. This is important. ?Contact a doctor if: ?You have another gout attack. ?You still have symptoms of a gout attack after 10 days of treatment. ?You have problems (side effects) because of your medicines. ?You have chills or a fever. ?You have burning pain when you pee (urinate). ?You have pain in your lower back or belly. ?Get help right away if: ?You have very bad pain. ?Your pain cannot be controlled. ?You cannot pee. ?Summary ?Gout is painful swelling of the joints. ?The most common site of pain is the big toe, but it can affect other joints. ?Medicines and avoiding some foods can help to prevent and treat gout attacks. ?This information is not intended to replace advice given to you by your health care provider. Make sure you discuss any questions you have with your health care provider. ?Document Revised: 06/18/2018 Document Reviewed: 06/30/2018 ?Elsevier Patient Education ? 2022 [employer] ? ?

## 2022-01-22 NOTE — Progress Notes (Signed)
°  HPI with pertinent ROS:   CC: Left foot pain  HPI: Very pleasant 76 year old female presenting today with 1 day of pain, tenderness, swelling, and redness to her left great toe at the MTP joint.  Had a similar occurrence in June 2021 that was treated for gout with colchicine.  After 3 doses, her symptoms fully resolved and did not recur until now.  No recent changes in dietary intake and does not regularly eat or drink foods that are known to cause gout.  Having difficulty walking and putting pressure on her foot.  Notes that she is unable to bend the great toe at the affected joint.  I reviewed the past medical history, family history, social history, surgical history, and allergies today and no changes were needed.  Please see the problem list section below in epic for further details.   Physical exam:   General: Well Developed, well nourished, and in no acute distress.  Neuro: Alert and oriented x3.  HEENT: Normocephalic, atraumatic.  Skin: Warm and dry. Cardiac: Regular rate and rhythm, no murmurs rubs or gallops, no lower extremity edema.  Respiratory: Clear to auscultation bilaterally. Not using accessory muscles, speaking in full sentences. Left foot-pulses 2+, neurovascularly intact.  First MTP joint red, tender, erythematous with limited range of motion due to pain.   Impression and Recommendations:    1. Acute idiopathic gout involving toe of left foot Senyene colchicine 1.2 mg now with a repeat of 0.6 mg in 1 hour.  Okay to use 0.6 mg twice daily as needed until flare has resolved.  Information regarding gout and potential causes provided with AVS.  Return if symptoms worsen or fail to improve. ___________________________________________ Clearnce Sorrel, DNP, APRN, FNP-BC [facility] [place]

## 2022-02-05 DIAGNOSIS — H0102A Squamous blepharitis right eye, upper and lower eyelids: Secondary | ICD-10-CM | POA: Diagnosis not present

## 2022-02-05 DIAGNOSIS — H401122 Primary open-angle glaucoma, left eye, moderate stage: Secondary | ICD-10-CM | POA: Diagnosis not present

## 2022-02-05 DIAGNOSIS — H0102B Squamous blepharitis left eye, upper and lower eyelids: Secondary | ICD-10-CM | POA: Diagnosis not present

## 2022-02-05 DIAGNOSIS — H401113 Primary open-angle glaucoma, right eye, severe stage: Secondary | ICD-10-CM | POA: Diagnosis not present

## 2022-02-05 DIAGNOSIS — H43812 Vitreous degeneration, left eye: Secondary | ICD-10-CM | POA: Diagnosis not present

## 2022-02-05 DIAGNOSIS — Z961 Presence of intraocular lens: Secondary | ICD-10-CM | POA: Diagnosis not present

## 2022-02-05 DIAGNOSIS — H26492 Other secondary cataract, left eye: Secondary | ICD-10-CM | POA: Diagnosis not present

## 2022-03-13 DIAGNOSIS — Z9889 Other specified postprocedural states: Secondary | ICD-10-CM | POA: Diagnosis not present

## 2022-03-13 DIAGNOSIS — H906 Mixed conductive and sensorineural hearing loss, bilateral: Secondary | ICD-10-CM | POA: Diagnosis not present

## 2022-04-01 ENCOUNTER — Other Ambulatory Visit: Payer: Self-pay

## 2022-04-01 ENCOUNTER — Other Ambulatory Visit: Payer: Self-pay | Admitting: Medical-Surgical

## 2022-04-01 MED ORDER — OLMESARTAN MEDOXOMIL 40 MG PO TABS: 40.0000 mg | ORAL_TABLET | Freq: Every day | ORAL | 0 refills | Status: DC

## 2022-04-16 DIAGNOSIS — H4089 Other specified glaucoma: Secondary | ICD-10-CM | POA: Diagnosis not present

## 2022-05-20 DIAGNOSIS — L570 Actinic keratosis: Secondary | ICD-10-CM | POA: Diagnosis not present

## 2022-05-20 DIAGNOSIS — D225 Melanocytic nevi of trunk: Secondary | ICD-10-CM | POA: Diagnosis not present

## 2022-05-20 DIAGNOSIS — Z85828 Personal history of other malignant neoplasm of skin: Secondary | ICD-10-CM | POA: Diagnosis not present

## 2022-05-20 DIAGNOSIS — L814 Other melanin hyperpigmentation: Secondary | ICD-10-CM | POA: Diagnosis not present

## 2022-05-20 DIAGNOSIS — Z86018 Personal history of other benign neoplasm: Secondary | ICD-10-CM | POA: Diagnosis not present

## 2022-05-20 DIAGNOSIS — L719 Rosacea, unspecified: Secondary | ICD-10-CM | POA: Diagnosis not present

## 2022-05-20 DIAGNOSIS — D2272 Melanocytic nevi of left lower limb, including hip: Secondary | ICD-10-CM | POA: Diagnosis not present

## 2022-05-20 DIAGNOSIS — Z86006 Personal history of melanoma in-situ: Secondary | ICD-10-CM | POA: Diagnosis not present

## 2022-05-20 DIAGNOSIS — L820 Inflamed seborrheic keratosis: Secondary | ICD-10-CM | POA: Diagnosis not present

## 2022-05-20 DIAGNOSIS — L578 Other skin changes due to chronic exposure to nonionizing radiation: Secondary | ICD-10-CM | POA: Diagnosis not present

## 2022-05-20 DIAGNOSIS — L309 Dermatitis, unspecified: Secondary | ICD-10-CM | POA: Diagnosis not present

## 2022-05-20 DIAGNOSIS — L821 Other seborrheic keratosis: Secondary | ICD-10-CM | POA: Diagnosis not present

## 2022-05-20 DIAGNOSIS — D485 Neoplasm of uncertain behavior of skin: Secondary | ICD-10-CM | POA: Diagnosis not present

## 2022-06-16 DIAGNOSIS — Z01419 Encounter for gynecological examination (general) (routine) without abnormal findings: Secondary | ICD-10-CM | POA: Diagnosis not present

## 2022-06-16 DIAGNOSIS — Z1231 Encounter for screening mammogram for malignant neoplasm of breast: Secondary | ICD-10-CM | POA: Diagnosis not present

## 2022-06-26 ENCOUNTER — Other Ambulatory Visit: Payer: Self-pay | Admitting: Medical-Surgical

## 2022-06-26 NOTE — Telephone Encounter (Signed)
Please contact the patient to schedule a follow up appointment with Samuel Bouche for further refills.   I sent a 90 day supply with no refills.  Last office visit was 01/22/2022  Last refilled 06/19/2021  Thank you,  Arbie Cookey, CMA

## 2022-06-26 NOTE — Telephone Encounter (Signed)
Patient has been scheduled for Amy [name]'s next available appt on 07/29/22. AMUCK

## 2022-07-12 ENCOUNTER — Other Ambulatory Visit: Payer: Self-pay | Admitting: Medical-Surgical

## 2022-07-29 ENCOUNTER — Ambulatory Visit: Payer: Medicare Other | Admitting: Medical-Surgical

## 2022-07-30 NOTE — Progress Notes (Unsigned)
   Established Patient Office Visit  Subjective   Patient ID: Amy [name], female   DOB: 1946/06/02 Age: 76 y.o. MRN: [id number]   No chief complaint on file.   HPI Pleasant 76 year old female presenting today for follow-up on:  Hypertension:  Hyperlipidemia:   Objective:    There were no vitals filed for this visit.  Physical Exam   No results found for this or any previous visit (from the past 24 hour(s)).   {Labs (Optional):23779}  The 10-year ASCVD risk score (Arnett DK, et al., 2019) is: 25.1%   Values used to calculate the score:     Age: 72 years     Sex: Female     Is Non-Hispanic African American: No     Diabetic: No     Tobacco smoker: No     Systolic Blood Pressure: 883 mmHg     Is BP treated: Yes     HDL Cholesterol: 46 mg/dL     Total Cholesterol: 230 mg/dL   Assessment & Plan:   No problem-specific Assessment & Plan notes found for this encounter.   No follow-ups on file.  ___________________________________________ Clearnce Sorrel, DNP, APRN, FNP-BC [facility] [place]

## 2022-07-31 ENCOUNTER — Encounter: Payer: Self-pay | Admitting: Medical-Surgical

## 2022-07-31 ENCOUNTER — Ambulatory Visit (INDEPENDENT_AMBULATORY_CARE_PROVIDER_SITE_OTHER): Payer: Medicare Other | Admitting: Medical-Surgical

## 2022-07-31 VITALS — BP 183/70 | HR 60 | Resp 20 | Ht 68.0 in | Wt 189.5 lb

## 2022-07-31 DIAGNOSIS — D225 Melanocytic nevi of trunk: Secondary | ICD-10-CM | POA: Insufficient documentation

## 2022-07-31 DIAGNOSIS — I129 Hypertensive chronic kidney disease with stage 1 through stage 4 chronic kidney disease, or unspecified chronic kidney disease: Secondary | ICD-10-CM

## 2022-07-31 DIAGNOSIS — I10 Essential (primary) hypertension: Secondary | ICD-10-CM | POA: Diagnosis not present

## 2022-07-31 DIAGNOSIS — M792 Neuralgia and neuritis, unspecified: Secondary | ICD-10-CM

## 2022-07-31 DIAGNOSIS — E7800 Pure hypercholesterolemia, unspecified: Secondary | ICD-10-CM | POA: Diagnosis not present

## 2022-07-31 MED ORDER — OLMESARTAN MEDOXOMIL 40 MG PO TABS: 40.0000 mg | ORAL_TABLET | Freq: Every day | ORAL | 1 refills | Status: DC

## 2022-07-31 MED ORDER — HYDROCHLOROTHIAZIDE 25 MG PO TABS: 25.0000 mg | ORAL_TABLET | Freq: Every day | ORAL | 1 refills | Status: DC

## 2022-08-01 LAB — LIPID PANEL
Cholesterol: 249 mg/dL — ABNORMAL HIGH (ref <200)
HDL: 51 mg/dL (ref >=50)
LDL Cholesterol (Calc): 168 mg/dL (calc) — ABNORMAL HIGH
Non-HDL Cholesterol (Calc): 198 mg/dL (calc) — ABNORMAL HIGH (ref <130)
Total CHOL/HDL Ratio: 4.9 (calc) (ref <5.0)
Triglycerides: 157 mg/dL — ABNORMAL HIGH (ref <150)

## 2022-08-01 LAB — CBC WITH DIFFERENTIAL/PLATELET
Absolute Monocytes: 449 cells/uL (ref 200–950)
Basophils Absolute: 59 cells/uL (ref 0–200)
Basophils Relative: 0.9 %
Eosinophils Absolute: 481 cells/uL (ref 15–500)
Eosinophils Relative: 7.4 %
HCT: 37.6 % (ref 35.0–45.0)
Hemoglobin: 12.4 g/dL (ref 11.7–15.5)
Lymphs Abs: 1528 cells/uL (ref 850–3900)
MCH: 29.6 pg (ref 27.0–33.0)
MCHC: 33 g/dL (ref 32.0–36.0)
MCV: 89.7 fL (ref 80.0–100.0)
MPV: 10.4 fL (ref 7.5–12.5)
Monocytes Relative: 6.9 %
Neutro Abs: 3985 cells/uL (ref 1500–7800)
Neutrophils Relative %: 61.3 %
Platelets: 267 10*3/uL (ref 140–400)
RBC: 4.19 10*6/uL (ref 3.80–5.10)
RDW: 12.3 % (ref 11.0–15.0)
Total Lymphocyte: 23.5 %
WBC: 6.5 10*3/uL (ref 3.8–10.8)

## 2022-08-01 LAB — COMPLETE METABOLIC PANEL WITH GFR
AG Ratio: 1.7 (calc) (ref 1.0–2.5)
ALT: 16 U/L (ref 6–29)
AST: 16 U/L (ref 10–35)
Albumin: 4.2 g/dL (ref 3.6–5.1)
Alkaline phosphatase (APISO): 94 U/L (ref 37–153)
BUN/Creatinine Ratio: 23 (calc) — ABNORMAL HIGH (ref 6–22)
BUN: 25 mg/dL (ref 7–25)
CO2: 29 mmol/L (ref 20–32)
Calcium: 10.1 mg/dL (ref 8.6–10.4)
Chloride: 102 mmol/L (ref 98–110)
Creat: 1.07 mg/dL — ABNORMAL HIGH (ref 0.60–1.00)
Globulin: 2.5 g/dL (calc) (ref 1.9–3.7)
Glucose, Bld: 87 mg/dL (ref 65–99)
Potassium: 4.4 mmol/L (ref 3.5–5.3)
Sodium: 140 mmol/L (ref 135–146)
Total Bilirubin: 0.4 mg/dL (ref 0.2–1.2)
Total Protein: 6.7 g/dL (ref 6.1–8.1)
eGFR: 54 mL/min/{1.73_m2} — ABNORMAL LOW (ref >=60)

## 2022-08-05 DIAGNOSIS — H401122 Primary open-angle glaucoma, left eye, moderate stage: Secondary | ICD-10-CM | POA: Diagnosis not present

## 2022-08-05 DIAGNOSIS — H401113 Primary open-angle glaucoma, right eye, severe stage: Secondary | ICD-10-CM | POA: Diagnosis not present

## 2022-08-05 DIAGNOSIS — H43812 Vitreous degeneration, left eye: Secondary | ICD-10-CM | POA: Diagnosis not present

## 2022-08-05 DIAGNOSIS — H0102B Squamous blepharitis left eye, upper and lower eyelids: Secondary | ICD-10-CM | POA: Diagnosis not present

## 2022-08-05 DIAGNOSIS — H26492 Other secondary cataract, left eye: Secondary | ICD-10-CM | POA: Diagnosis not present

## 2022-08-05 DIAGNOSIS — Z961 Presence of intraocular lens: Secondary | ICD-10-CM | POA: Diagnosis not present

## 2022-08-05 DIAGNOSIS — H0102A Squamous blepharitis right eye, upper and lower eyelids: Secondary | ICD-10-CM | POA: Diagnosis not present

## 2022-08-14 ENCOUNTER — Encounter: Payer: Self-pay | Admitting: Medical-Surgical

## 2022-08-18 DIAGNOSIS — D0511 Intraductal carcinoma in situ of right breast: Secondary | ICD-10-CM | POA: Diagnosis not present

## 2022-10-05 ENCOUNTER — Encounter: Payer: Self-pay | Admitting: Emergency Medicine

## 2022-10-05 ENCOUNTER — Ambulatory Visit
Admission: EM | Admit: 2022-10-05 | Discharge: 2022-10-05 | Disposition: A | Discharge status: Home / Self Care | Payer: Medicare Other | Attending: Family Medicine | Admitting: Family Medicine

## 2022-10-05 DIAGNOSIS — J209 Acute bronchitis, unspecified: Secondary | ICD-10-CM | POA: Diagnosis not present

## 2022-10-05 DIAGNOSIS — R062 Wheezing: Secondary | ICD-10-CM | POA: Diagnosis not present

## 2022-10-05 MED ORDER — HYDROCOD POLI-CHLORPHE POLI ER 10-8 MG/5ML PO SUER: 5.0000 mL | Freq: Two times a day (BID) | ORAL | 0 refills | Status: DC | PRN

## 2022-10-05 MED ORDER — ALBUTEROL SULFATE (2.5 MG/3ML) 0.083% IN NEBU: 2.5000 mg | INHALATION_SOLUTION | Freq: Four times a day (QID) | RESPIRATORY_TRACT | 0 refills | Status: DC | PRN

## 2022-10-05 MED ORDER — AZITHROMYCIN 250 MG PO TABS: 250.0000 mg | ORAL_TABLET | Freq: Every day | ORAL | 0 refills | Status: DC

## 2022-10-05 NOTE — ED Triage Notes (Signed)
Patient states that she has bronchitis and wheezing x 2 weeks.  Non-productive cough, chest congestion.  Patient is afebrile.

## 2022-10-05 NOTE — ED Provider Notes (Signed)
[name]    CSN: [id number] Arrival date & time: 10/05/22  1132      History   Chief Complaint Chief Complaint  Patient presents with   Bronchitis    HPI Amy [name] is a 76 y.o. female.   HPI  Pleasant woman known to me from prior visit for bronchitis.  She is back with cough for 3 weeks.  It is progressively getting worse.  She is having shortness of breath.  She is out of her albuterol.  Uses this with nebulizer.  States is unable to tolerate prednisone.  Has coughing and chest congestion.  Coughing up mucus.  Shortness of breath.  Fatigue.  Cough is keeping her awake at night.  Past Medical History:  Diagnosis Date   Allergy 1977   Face swelling   Arthritis    Breast cancer ([place])    Bronchitis    Glaucoma Several years   Severe   Hypertension    Skin cancer     Patient Active Problem List   Diagnosis Date Noted   Melanocytic nevi of trunk 07/31/2022   Benign hypertensive renal disease 06/19/2021   Neck pain 06/19/2021   Gout 10/11/2020   Neuropathy 10/11/2020   Glaucoma 08/09/2020   History of tympanoplasty 03/09/2020   Mixed conductive and sensorineural hearing loss of both ears 03/09/2020   Essential hypertension 10/10/2019   Hypercholesterolemia 10/10/2019   Stage 3 chronic kidney disease ([place]) 10/10/2019   Body mass index (BMI) of 25.0 to 29.9 04/25/2019   Menopausal syndrome 04/25/2019   Ductal carcinoma in situ (DCIS) of right breast 05/08/2017   History of right breast cancer 12/22/2016    Past Surgical History:  Procedure Laterality Date   ABDOMINAL HYSTERECTOMY     APPENDECTOMY     BREAST SURGERY  April 2018   CESAREAN SECTION     COSMETIC SURGERY  2017   EYE SURGERY  09-2016/01-2017   Glaucoma/Cataract surgery   FACIAL COSMETIC SURGERY     "eye lid surgery"   GLAUCOMA SURGERY     JOINT REPLACEMENT     KNEE ARTHROSCOPY Bilateral    torn meniscus on bilateral knees   MASTECTOMY Right    MASTECTOMY COMPLETE /  SIMPLE W/ SENTINEL NODE BIOPSY Right 05/08/2017   MASTECTOMY W/ SENTINEL NODE BIOPSY Right 05/08/2017   Procedure: RIGHT MASTECTOMY WITH SENTINEL LYMPH NODE BIOPSY;  Surgeon: Jovita Kussmaul, MD;  Location: [facility];  Service: General;  Laterality: Right;   NOSE SURGERY     deviated septum    titanium ear implant Left    "for hearing" left, Dr. Thornell Mule   TUBAL LIGATION      OB History   No obstetric history on file.      Home Medications    Prior to Admission medications   Medication Sig Start Date End Date Taking? Authorizing Provider  albuterol (PROVENTIL) (2.5 MG/3ML) 0.083% nebulizer solution Take 3 mLs (2.5 mg total) by nebulization every 6 (six) hours as needed for wheezing or shortness of breath. 10/05/22  Yes Raylene Everts, MD  azithromycin (ZITHROMAX) 250 MG tablet Take 1 tablet (250 mg total) by mouth daily. Take first 2 tablets together, then 1 every day until finished. 10/05/22  Yes Raylene Everts, MD  chlorpheniramine-HYDROcodone (TUSSIONEX) 10-8 MG/5ML Take 5 mLs by mouth every 12 (twelve) hours as needed for cough. 10/05/22  Yes Raylene Everts, MD  dorzolamide-timolol (COSOPT) 22.3-6.8 MG/ML ophthalmic solution Place 1 drop into both eyes 2 (  two) times daily.   Yes [provider]  hydrochlorothiazide (HYDRODIURIL) 25 MG tablet Take 1 tablet (25 mg total) by mouth daily. 07/31/22  Yes Jessup, Joy, NP  latanoprost (XALATAN) 0.005 % ophthalmic solution Place 1 drop into both eyes at bedtime.   Yes [provider]  olmesartan (BENICAR) 40 MG tablet Take 1 tablet (40 mg total) by mouth daily. 07/31/22  Yes Samuel Bouche, NP    Family History Family History  Problem Relation Age of Onset   Hypertension Mother    Skin cancer Father    Prostate cancer Father    Cancer Father    Breast cancer Sister    Cancer Sister     Social History Social History   Tobacco Use   Smoking status: Never   Smokeless tobacco: Never  Vaping Use   Vaping Use:  Never used  Substance Use Topics   Alcohol use: Yes    Alcohol/week: 1.0 standard drink of alcohol    Types: 1 Glasses of wine per week    Comment: Very little alcohol use   Drug use: No     Allergies   Meperidine, Gabapentin, Rosuvastatin, Tape, Tapentadol, and Methylprednisolone   Review of Systems Review of Systems See HPI  Physical Exam Triage Vital Signs ED Triage Vitals  Enc Vitals Group     BP 10/05/22 1150 (!) 145/78     Pulse Rate 10/05/22 1150 89     Resp 10/05/22 1150 18     Temp 10/05/22 1150 99.6 F (37.6 C)     Temp Source 10/05/22 1150 Oral     SpO2 10/05/22 1150 97 %     Weight 10/05/22 1151 190 lb (86.2 kg)     Height 10/05/22 1151 '5\' 8"'$  (1.727 m)     Head Circumference --      Peak Flow --      Pain Score 10/05/22 1151 0     Pain Loc --      Pain Edu? --      Excl. in [place]? --    No data found.  Updated Vital Signs BP (!) 145/78 (BP Location: Left Arm)   Pulse 89   Temp 99.6 F (37.6 C) (Oral)   Resp 18   Ht '5\' 8"'$  (1.727 m)   Wt 86.2 kg   SpO2 97%   BMI 28.89 kg/m      Physical Exam Constitutional:      General: She is not in acute distress.    Appearance: She is well-developed and normal weight. She is ill-appearing.     Comments: Hoarse voice  HENT:     Head: Normocephalic and atraumatic.     Right Ear: Tympanic membrane and ear canal normal.     Left Ear: Tympanic membrane and ear canal normal.     Nose: Nose normal. No rhinorrhea.     Mouth/Throat:     Mouth: Mucous membranes are dry.     Pharynx: No posterior oropharyngeal erythema.  Eyes:     Conjunctiva/sclera: Conjunctivae normal.     Pupils: Pupils are equal, round, and reactive to light.  Cardiovascular:     Rate and Rhythm: Normal rate and regular rhythm.  Pulmonary:     Effort: Pulmonary effort is normal. No respiratory distress.     Breath sounds: Wheezing and rhonchi present.  Abdominal:     General: There is no distension.     Palpations: Abdomen is soft.   Musculoskeletal:  General: Normal range of motion.     Cervical back: Normal range of motion.  Lymphadenopathy:     Cervical: No cervical adenopathy.  Skin:    General: Skin is warm and dry.  Neurological:     Mental Status: She is alert.  Psychiatric:        Mood and Affect: Mood normal.        Behavior: Behavior normal.      UC Treatments / Results  Labs (all labs ordered are listed, but only abnormal results are displayed) Labs Reviewed - No data to display  EKG   Radiology No results found.  Procedures Procedures (including critical care time)  Medications Ordered in UC Medications - No data to display  Initial Impression / Assessment and Plan / UC Course  I have reviewed the triage vital signs and the nursing notes.  Pertinent labs & imaging results that were available during my care of the patient were reviewed by me and considered in my medical decision making (see chart for details).    Final Clinical Impressions(s) / UC Diagnoses   Final diagnoses:  Acute bronchitis, unspecified organism  Wheezing     Discharge Instructions      Drink lots of fluids Take the antibiotic as directed Use albuterol inhaled by nebulizer for wheeze and shortness of breath I have refilled your cough medicine See your doctor if not better by next week   ED Prescriptions     Medication Sig Dispense Auth. Provider   azithromycin (ZITHROMAX) 250 MG tablet Take 1 tablet (250 mg total) by mouth daily. Take first 2 tablets together, then 1 every day until finished. 6 tablet Raylene Everts, MD   chlorpheniramine-HYDROcodone (TUSSIONEX) 10-8 MG/5ML Take 5 mLs by mouth every 12 (twelve) hours as needed for cough. 115 mL Raylene Everts, MD   albuterol (PROVENTIL) (2.5 MG/3ML) 0.083% nebulizer solution Take 3 mLs (2.5 mg total) by nebulization every 6 (six) hours as needed for wheezing or shortness of breath. 75 mL Raylene Everts, MD      PDMP not reviewed  this encounter.   Raylene Everts, MD 10/05/22 [id number]

## 2022-10-05 NOTE — Discharge Instructions (Signed)
Drink lots of fluids Take the antibiotic as directed Use albuterol inhaled by nebulizer for wheeze and shortness of breath I have refilled your cough medicine See your doctor if not better by next week

## 2022-10-06 ENCOUNTER — Telehealth: Payer: Self-pay | Admitting: Emergency Medicine

## 2022-10-06 NOTE — Telephone Encounter (Signed)
Spoke with patient states that she is on the right path, taken the medication as prescribed.  Will follow up as needed.

## 2022-10-15 DIAGNOSIS — Z789 Other specified health status: Secondary | ICD-10-CM | POA: Diagnosis not present

## 2022-10-15 DIAGNOSIS — E7800 Pure hypercholesterolemia, unspecified: Secondary | ICD-10-CM | POA: Diagnosis not present

## 2022-10-15 DIAGNOSIS — N1831 Chronic kidney disease, stage 3a: Secondary | ICD-10-CM | POA: Diagnosis not present

## 2022-10-15 DIAGNOSIS — I10 Essential (primary) hypertension: Secondary | ICD-10-CM | POA: Diagnosis not present

## 2022-10-15 DIAGNOSIS — Z0000 Encounter for general adult medical examination without abnormal findings: Secondary | ICD-10-CM | POA: Diagnosis not present

## 2022-10-22 DIAGNOSIS — H4089 Other specified glaucoma: Secondary | ICD-10-CM | POA: Diagnosis not present

## 2022-12-05 DIAGNOSIS — Z86006 Personal history of melanoma in-situ: Secondary | ICD-10-CM | POA: Diagnosis not present

## 2022-12-05 DIAGNOSIS — D0461 Carcinoma in situ of skin of right upper limb, including shoulder: Secondary | ICD-10-CM | POA: Diagnosis not present

## 2022-12-05 DIAGNOSIS — D2272 Melanocytic nevi of left lower limb, including hip: Secondary | ICD-10-CM | POA: Diagnosis not present

## 2022-12-05 DIAGNOSIS — D225 Melanocytic nevi of trunk: Secondary | ICD-10-CM | POA: Diagnosis not present

## 2022-12-05 DIAGNOSIS — L578 Other skin changes due to chronic exposure to nonionizing radiation: Secondary | ICD-10-CM | POA: Diagnosis not present

## 2022-12-05 DIAGNOSIS — L570 Actinic keratosis: Secondary | ICD-10-CM | POA: Diagnosis not present

## 2022-12-05 DIAGNOSIS — D485 Neoplasm of uncertain behavior of skin: Secondary | ICD-10-CM | POA: Diagnosis not present

## 2022-12-05 DIAGNOSIS — Z85828 Personal history of other malignant neoplasm of skin: Secondary | ICD-10-CM | POA: Diagnosis not present

## 2022-12-05 DIAGNOSIS — Z86018 Personal history of other benign neoplasm: Secondary | ICD-10-CM | POA: Diagnosis not present

## 2022-12-05 DIAGNOSIS — L821 Other seborrheic keratosis: Secondary | ICD-10-CM | POA: Diagnosis not present

## 2022-12-05 DIAGNOSIS — L719 Rosacea, unspecified: Secondary | ICD-10-CM | POA: Diagnosis not present

## 2023-01-12 ENCOUNTER — Ambulatory Visit (INDEPENDENT_AMBULATORY_CARE_PROVIDER_SITE_OTHER): Payer: Medicare Other | Admitting: Medical-Surgical

## 2023-01-12 DIAGNOSIS — Z0000 Encounter for general adult medical examination without abnormal findings: Secondary | ICD-10-CM

## 2023-01-12 NOTE — Progress Notes (Signed)
MEDICARE ANNUAL WELLNESS VISIT  01/12/2023  Telephone Visit Disclaimer This Medicare AWV was conducted by telephone due to national recommendations for restrictions regarding the COVID-19 Pandemic (e.g. social distancing).  I verified, using two identifiers, that I am speaking with Amy [name] or their authorized healthcare agent. I discussed the limitations, risks, security, and privacy concerns of performing an evaluation and management service by telephone and the potential availability of an in-person appointment in the future. The patient expressed understanding and agreed to proceed.  Location of Patient: Home Location of Provider (nurse):  In the office.  Subjective:    Amy [name] is a 77 y.o. female patient of Samuel Bouche, NP who had a Medicare Annual Wellness Visit today via telephone. Amy [name] is Retired and lives with their spouse. she has 4 children. she reports that she is socially active and does interact with friends/family regularly. she is minimally physically active and enjoys going to antique shops and staying active.  Patient Care Team: Samuel Bouche, NP as PCP - General (Nurse Practitioner) Jovita Kussmaul, MD as Consulting Physician (General Surgery)     01/12/2023    9:44 AM 01/10/2022    8:45 AM 06/19/2021    4:00 PM 07/10/2017    9:03 AM 05/08/2017    6:04 PM 04/27/2017    1:11 PM  Advanced Directives  Does Patient Have a Medical Advance Directive? Yes Yes Yes Yes Yes No  Type of Advance Directive Living will [place];Living will [place];Living will;Out of facility DNR (pink MOST or yellow form)  [place];Living will   Does patient want to make changes to medical advance directive? No - Patient declined No - Patient declined No - Patient declined  No - Patient declined   Copy of [place] in Chart?  Yes - validated most recent copy scanned in chart (See row information) No -  copy requested  Yes   Would patient like information on creating a medical advance directive?      Yes (MAU/Ambulatory/Procedural Areas - Information given)    Hospital Utilization Over the Past 12 Months: # of hospitalizations or ER visits: 0 # of surgeries: 0  Review of Systems    Patient reports that her overall health is unchanged compared to last year.  History obtained from chart review and the patient  Patient Reported Readings (BP, Pulse, CBG, Weight, etc) none  Pain Assessment Pain : 0-10 Pain Score: 1  Pain Type: Neuropathic pain Pain Location: Leg Pain Orientation: Left, Right Pain Descriptors / Indicators: Aching Pain Onset: More than a month ago Pain Frequency: Constant     Current Medications & Allergies (verified) Allergies as of 01/12/2023       Reactions   Meperidine Anaphylaxis   Gabapentin Other (See Comments)   Aphasia    Rosuvastatin Other (See Comments)   Myalgias   Tape    Adhesive tape::bruising   Tapentadol Other (See Comments)   Adhesive tape::bruising   Methylprednisolone Anxiety, Swelling        Medication List        Accurate as of January 12, 2023  9:54 AM. If you have any questions, ask your nurse or doctor.          STOP taking these medications    azithromycin 250 MG tablet Commonly known as: ZITHROMAX       TAKE these medications    albuterol (2.5 MG/3ML) 0.083% nebulizer solution Commonly known  as: PROVENTIL Take 3 mLs (2.5 mg total) by nebulization every 6 (six) hours as needed for wheezing or shortness of breath.   chlorpheniramine-HYDROcodone 10-8 MG/5ML Commonly known as: TUSSIONEX Take 5 mLs by mouth every 12 (twelve) hours as needed for cough.   dorzolamide-timolol 2-0.5 % ophthalmic solution Commonly known as: COSOPT Place 1 drop into both eyes 2 (two) times daily.   hydrochlorothiazide 25 MG tablet Commonly known as: HYDRODIURIL Take 1 tablet (25 mg total) by mouth daily.   latanoprost 0.005 %  ophthalmic solution Commonly known as: XALATAN Place 1 drop into both eyes at bedtime.   olmesartan 40 MG tablet Commonly known as: BENICAR Take 1 tablet (40 mg total) by mouth daily.   triamcinolone cream 0.1 % Commonly known as: KENALOG SMARTSIG:Sparingly Topical Twice Daily        History (reviewed): Past Medical History:  Diagnosis Date   Allergy 1977   Face swelling   Arthritis    Breast cancer ([place])    Bronchitis    Glaucoma Several years   Severe   Hypercholesterolemia 10/10/2019   Hypertension    Neuropathy 10/11/2020   Skin cancer    Stage 3 chronic kidney disease ([place]) 10/10/2019   Past Surgical History:  Procedure Laterality Date   ABDOMINAL HYSTERECTOMY     APPENDECTOMY     BREAST SURGERY  April 2018   CESAREAN SECTION  07/1976   CESAREAN SECTION  03/1979   COSMETIC SURGERY  2017   EYE SURGERY  09-2016/01-2017   Glaucoma/Cataract surgery   FACIAL COSMETIC SURGERY     "eye lid surgery"   GLAUCOMA SURGERY     JOINT REPLACEMENT     KNEE ARTHROSCOPY Bilateral    torn meniscus on bilateral knees   MASTECTOMY Right    MASTECTOMY COMPLETE / SIMPLE W/ SENTINEL NODE BIOPSY Right 05/08/2017   MASTECTOMY W/ SENTINEL NODE BIOPSY Right 05/08/2017   Procedure: RIGHT MASTECTOMY WITH SENTINEL LYMPH NODE BIOPSY;  Surgeon: Jovita Kussmaul, MD;  Location: [facility];  Service: General;  Laterality: Right;   NOSE SURGERY     deviated septum    titanium ear implant Left    "for hearing" left, Dr. Thornell Mule   TUBAL LIGATION     Family History  Problem Relation Age of Onset   Hypertension Mother    Skin cancer Father    Prostate cancer Father    Cancer Father    Breast cancer Sister    Cancer Sister    Social History   Socioeconomic History   Marital status: Married    Spouse name: Onesti Bonfiglio   Number of children: 4   Years of education: 12   Highest education level: 12th grade  Occupational History   Occupation: Retired  Tobacco Use   Smoking status:  Never   Smokeless tobacco: Never  Vaping Use   Vaping Use: Never used  Substance and Sexual Activity   Alcohol use: Yes    Alcohol/week: 1.0 standard drink of alcohol    Types: 1 Glasses of wine per week    Comment: Very little alcohol use   Drug use: No   Sexual activity: Not Currently    Partners: Male    Birth control/protection: None  Other Topics Concern   Not on file  Social History Narrative   Lives with husband. She has four children. She enjoys going to antique shopping and stays active.   Social Determinants of Health   Financial Resource Strain: Low Risk  (01/08/2023)  Overall Financial Resource Strain (CARDIA)    Difficulty of Paying Living Expenses: Not hard at all  Food Insecurity: No Food Insecurity (01/08/2023)   Hunger Vital Sign    Worried About Running Out of Food in the Last Year: Never true    Ran Out of Food in the Last Year: Never true  Transportation Needs: No Transportation Needs (01/08/2023)   PRAPARE - Hydrologist (Medical): No    Lack of Transportation (Non-Medical): No  Physical Activity: Inactive (01/08/2023)   Exercise Vital Sign    Days of Exercise per Week: 0 days    Minutes of Exercise per Session: 0 min  Stress: No Stress Concern Present (01/08/2023)   [place]    Feeling of Stress : Not at all  Social Connections: [place] (01/12/2023)   Social Connection and Isolation Panel [NHANES]    Frequency of Communication with Friends and Family: Three times a week    Frequency of Social Gatherings with Friends and Family: Three times a week    Attends Religious Services: More than 4 times per year    Active Member of Clubs or Organizations: Yes    Attends Archivist Meetings: Never    Marital Status: Married    Activities of Daily Living    01/08/2023    3:43 PM  In your present state of health, do you have any difficulty  performing the following activities:  Hearing? 1  Vision? 0  Difficulty concentrating or making decisions? 0  Walking or climbing stairs? 1  Dressing or bathing? 0  Doing errands, shopping? 0  Preparing Food and eating ? N  Using the Toilet? N  In the past six months, have you accidently leaked urine? N  Do you have problems with loss of bowel control? N  Managing your Medications? N  Managing your Finances? N  Housekeeping or managing your Housekeeping? N    Patient Education/ Literacy How often do you need to have someone help you when you read instructions, pamphlets, or other written materials from your doctor or pharmacy?: 1 - Never What is the last grade level you completed in school?: 12th grade  Exercise Current Exercise Habits: The patient does not participate in regular exercise at present, Exercise limited by: None identified  Diet Patient reports consuming 2 meals a day and 0 snack(s) a day Patient reports that her primary diet is: Regular Patient reports that she does have regular access to food.   Depression Screen    01/12/2023    9:47 AM 07/31/2022    1:53 PM 01/22/2022   11:14 AM 01/10/2022    8:48 AM 06/19/2021    4:00 PM  PHQ 2/9 Scores  PHQ - 2 Score 0 0 0 0 0  PHQ- 9 Score     0     Fall Risk    01/12/2023    9:44 AM 01/08/2023    3:43 PM 07/31/2022    1:53 PM 01/10/2022    8:47 AM 01/06/2022   10:06 AM  Fall Risk   Falls in the past year? 0 0 0 0 0  Number falls in past yr: 0 0 0 0   Injury with Fall? 0  0 0   Risk for fall due to : No Fall Risks  No Fall Risks No Fall Risks   Follow up Falls evaluation completed  Falls evaluation completed Falls evaluation completed  Objective:  Amy [name] seemed alert and oriented and she participated appropriately during our telephone visit.  Blood Pressure Weight BMI  BP Readings from Last 3 Encounters:  10/05/22 (!) 145/78  07/31/22 (!) 183/70  01/22/22 (!) 148/74   Wt Readings from Last 3  Encounters:  10/05/22 190 lb (86.2 kg)  07/31/22 189 lb 8 oz (86 kg)  01/22/22 199 lb (90.3 kg)   BMI Readings from Last 1 Encounters:  10/05/22 28.89 kg/m    *Unable to obtain current vital signs, weight, and BMI due to telephone visit type  Hearing/Vision  Amy [name] did not seem to have difficulty with hearing/understanding during the telephone conversation Reports that she has had a formal eye exam by an eye care professional within the past year Reports that she has had a formal hearing evaluation within the past year *Unable to fully assess hearing and vision during telephone visit type  Cognitive Function:    01/12/2023    9:49 AM 01/10/2022    8:55 AM  6CIT Screen  What Year? 0 points 0 points  What month? 0 points 0 points  What time? 0 points 0 points  Count back from 20 0 points 0 points  Months in reverse  0 points  Repeat phrase  0 points  Total Score  0 points   (Normal:0-7, Significant for Dysfunction: >8)  Normal Cognitive Function Screening: Yes   Immunization & Health Maintenance Record Immunization History  Administered Date(s) Administered   Pneumococcal Conjugate-13 03/23/2015, 04/12/2015   Pneumococcal Polysaccharide-23 04/04/2011, 04/04/2011, 03/23/2015, 03/23/2015   Pneumococcal-Unspecified 03/22/2012   Td 03/01/1999   Tdap 03/23/2011, 04/04/2011, 06/19/2021    Health Maintenance  Topic Date Due   COVID-19 Vaccine (1) 01/28/2023 (Originally 12/09/1951)   INFLUENZA VACCINE  03/22/2023 (Originally 07/22/2022)   Zoster Vaccines- Shingrix (1 of 2) 04/13/2023 (Originally 12/09/1965)   Hepatitis C Screening  01/13/2024 (Originally 12/10/1964)   Medicare Annual Wellness (AWV)  01/13/2024   COLONOSCOPY (Pts 45-62yr Insurance coverage will need to be confirmed)  01/02/2028   DTaP/Tdap/Td (5 - Td or Tdap) 06/20/2031   Pneumonia Vaccine 77 Years old  Completed   DEXA SCAN  Completed   HPV VACCINES  Aged Out       Assessment  This is a routine  wellness examination for Amy [name]  Health Maintenance: Due or Overdue There are no preventive care reminders to display for this patient.   BKinnie Feildoes not need a referral for Community Assistance: Care Management:   no Social Work:    no Prescription Assistance:  no Nutrition/Diabetes Education:  no   Plan:  Personalized Goals  Goals Addressed               This Visit's Progress     Patient Stated (pt-stated)        Patient stated that she would like to maintain her lifestyle.       Personalized Health Maintenance & Screening Recommendations  Influenza vaccine Shingles vaccine  Patient declined the vaccines.    Lung Cancer Screening Recommended: no (Low Dose CT Chest recommended if Age 78-80years, 30 pack-year currently smoking OR have quit w/in past 15 years) Hepatitis C Screening recommended: no HIV Screening recommended: no  Advanced Directives: Written information was not prepared per patient's request.  Referrals & Orders No orders of the defined types were placed in this encounter.   Follow-up Plan Follow-up with JSamuel Bouche NP as planned Medicare wellness visit in one year. Patient  will AVS on my chart.   I have personally reviewed and noted the following in the patient's chart:   Medical and social history Use of alcohol, tobacco or illicit drugs  Current medications and supplements Functional ability and status Nutritional status Physical activity Advanced directives List of other physicians Hospitalizations, surgeries, and ER visits in previous 12 months Vitals Screenings to include cognitive, depression, and falls Referrals and appointments  In addition, I have reviewed and discussed with Amy [name] certain preventive protocols, quality metrics, and best practice recommendations. A written personalized care plan for preventive services as well as general preventive health recommendations is available and can  be mailed to the patient at her request.      Tinnie Gens, RN BSN  01/12/2023

## 2023-01-12 NOTE — Patient Instructions (Signed)
[facility] Maintenance Summary and Written Plan of Care  [name] ,  Thank you for allowing me to perform your Medicare Annual Wellness Visit and for your ongoing commitment to your health.   Health Maintenance & Immunization History Health Maintenance  Topic Date Due   COVID-19 Vaccine (1) 01/28/2023 (Originally 12/09/1951)   INFLUENZA VACCINE  03/22/2023 (Originally 07/22/2022)   Zoster Vaccines- Shingrix (1 of 2) 04/13/2023 (Originally 12/09/1965)   Hepatitis C Screening  01/13/2024 (Originally 12/10/1964)   Medicare Annual Wellness (AWV)  01/13/2024   COLONOSCOPY (Pts 45-65yr Insurance coverage will need to be confirmed)  01/02/2028   DTaP/Tdap/Td (5 - Td or Tdap) 06/20/2031   Pneumonia Vaccine 77 Years old  Completed   DEXA SCAN  Completed   HPV VACCINES  Aged Out   Immunization History  Administered Date(s) Administered   Pneumococcal Conjugate-13 03/23/2015, 04/12/2015   Pneumococcal Polysaccharide-23 04/04/2011, 04/04/2011, 03/23/2015, 03/23/2015   Pneumococcal-Unspecified 03/22/2012   Td 03/01/1999   Tdap 03/23/2011, 04/04/2011, 06/19/2021    These are the patient goals that we discussed:  Goals Addressed               This Visit's Progress     Patient Stated (pt-stated)        Patient stated that she would like to maintain her lifestyle.         This is a list of Health Maintenance Items that are overdue or due now: There are no preventive care reminders to display for this patient.    Orders/Referrals Placed Today: No orders of the defined types were placed in this encounter.  (Contact our referral department at [phone] you have not spoken with someone about your referral appointment within the next 5 days)    Follow-up Plan Follow-up with JSamuel Bouche NP as planned Medicare wellness visit in one year. Patient will AVS on my chart.     Health Maintenance, Female Adopting a healthy lifestyle and getting  preventive care are important in promoting health and wellness. Ask your health care provider about: The right schedule for you to have regular tests and exams. Things you can do on your own to prevent diseases and keep yourself healthy. What should I know about diet, weight, and exercise? Eat a healthy diet  Eat a diet that includes plenty of vegetables, fruits, low-fat dairy products, and lean protein. Do not eat a lot of foods that are high in solid fats, added sugars, or sodium. Maintain a healthy weight Body mass index (BMI) is used to identify weight problems. It estimates body fat based on height and weight. Your health care provider can help determine your BMI and help you achieve or maintain a healthy weight. Get regular exercise Get regular exercise. This is one of the most important things you can do for your health. Most adults should: Exercise for at least 150 minutes each week. The exercise should increase your heart rate and make you sweat (moderate-intensity exercise). Do strengthening exercises at least twice a week. This is in addition to the moderate-intensity exercise. Spend less time sitting. Even light physical activity can be beneficial. Watch cholesterol and blood lipids Have your blood tested for lipids and cholesterol at 78years of age, then have this test every 5 years. Have your cholesterol levels checked more often if: Your lipid or cholesterol levels are high. You are older than 78years of age. You are at high risk for heart disease. What should I know about  cancer screening? Depending on your health history and family history, you may need to have cancer screening at various ages. This may include screening for: Breast cancer. Cervical cancer. Colorectal cancer. Skin cancer. Lung cancer. What should I know about heart disease, diabetes, and high blood pressure? Blood pressure and heart disease High blood pressure causes heart disease and increases the  risk of stroke. This is more likely to develop in people who have high blood pressure readings or are overweight. Have your blood pressure checked: Every 3-5 years if you are 102-55 years of age. Every year if you are 69 years old or older. Diabetes Have regular diabetes screenings. This checks your fasting blood sugar level. Have the screening done: Once every three years after age 28 if you are at a normal weight and have a low risk for diabetes. More often and at a younger age if you are overweight or have a high risk for diabetes. What should I know about preventing infection? Hepatitis B If you have a higher risk for hepatitis B, you should be screened for this virus. Talk with your health care provider to find out if you are at risk for hepatitis B infection. Hepatitis C Testing is recommended for: Everyone born from [birth year] through 1966. Anyone with known risk factors for hepatitis C. Sexually transmitted infections (STIs) Get screened for STIs, including gonorrhea and chlamydia, if: You are sexually active and are younger than 78 years of age. You are older than 78 years of age and your health care provider tells you that you are at risk for this type of infection. Your sexual activity has changed since you were last screened, and you are at increased risk for chlamydia or gonorrhea. Ask your health care provider if you are at risk. Ask your health care provider about whether you are at high risk for HIV. Your health care provider may recommend a prescription medicine to help prevent HIV infection. If you choose to take medicine to prevent HIV, you should first get tested for HIV. You should then be tested every 3 months for as long as you are taking the medicine. Pregnancy If you are about to stop having your period (premenopausal) and you may become pregnant, seek counseling before you get pregnant. Take 400 to 800 micrograms (mcg) of folic acid every day if you become pregnant. Ask for  birth control (contraception) if you want to prevent pregnancy. Osteoporosis and menopause Osteoporosis is a disease in which the bones lose minerals and strength with aging. This can result in bone fractures. If you are 77 years old or older, or if you are at risk for osteoporosis and fractures, ask your health care provider if you should: Be screened for bone loss. Take a calcium or vitamin D supplement to lower your risk of fractures. Be given hormone replacement therapy (HRT) to treat symptoms of menopause. Follow these instructions at home: Alcohol use Do not drink alcohol if: Your health care provider tells you not to drink. You are pregnant, may be pregnant, or are planning to become pregnant. If you drink alcohol: Limit how much you have to: 0-1 drink a day. Know how much alcohol is in your drink. In the [place] one drink equals one 12 oz bottle of beer (355 mL), one 5 oz glass of wine (148 mL), or one 1 oz glass of hard liquor (44 mL). Lifestyle Do not use any products that contain nicotine or tobacco. These products include cigarettes, chewing tobacco, and vaping devices,  such as e-cigarettes. If you need help quitting, ask your health care provider. Do not use street drugs. Do not share needles. Ask your health care provider for help if you need support or information about quitting drugs. General instructions Schedule regular health, dental, and eye exams. Stay current with your vaccines. Tell your health care provider if: You often feel depressed. You have ever been abused or do not feel safe at home. Summary Adopting a healthy lifestyle and getting preventive care are important in promoting health and wellness. Follow your health care provider's instructions about healthy diet, exercising, and getting tested or screened for diseases. Follow your health care provider's instructions on monitoring your cholesterol and blood pressure. This information is not intended to replace  advice given to you by your health care provider. Make sure you discuss any questions you have with your health care provider. Document Revised: 04/29/2021 Document Reviewed: 04/29/2021 Elsevier Patient Education  [place].

## 2023-02-02 ENCOUNTER — Ambulatory Visit (INDEPENDENT_AMBULATORY_CARE_PROVIDER_SITE_OTHER): Payer: Medicare Other | Admitting: Medical-Surgical

## 2023-02-02 ENCOUNTER — Encounter: Payer: Self-pay | Admitting: Medical-Surgical

## 2023-02-02 VITALS — BP 186/76 | HR 67 | Resp 20 | Ht 68.0 in | Wt 202.8 lb

## 2023-02-02 DIAGNOSIS — E7800 Pure hypercholesterolemia, unspecified: Secondary | ICD-10-CM

## 2023-02-02 DIAGNOSIS — N1831 Chronic kidney disease, stage 3a: Secondary | ICD-10-CM | POA: Diagnosis not present

## 2023-02-02 DIAGNOSIS — Z789 Other specified health status: Secondary | ICD-10-CM | POA: Diagnosis not present

## 2023-02-02 DIAGNOSIS — I10 Essential (primary) hypertension: Secondary | ICD-10-CM | POA: Diagnosis not present

## 2023-02-02 NOTE — Progress Notes (Signed)
Established Patient Office Visit  Subjective   Patient ID: Amy [name], female   DOB: 1946-11-21 Age: 77 y.o. MRN: [id number]   Chief Complaint  Patient presents with   Follow-up   Hypertension   HPI Pleasant 77 year old female presenting today for the following:  Hypertension: Taking olmesartan 40 mg and hydrochlorothiazide 25 mg daily, tolerating well without side effects.  Has a blood pressure cuff at home and admits that she does not check this on a daily basis.  She has not checked her blood pressure in the last week or so but notes that previous readings were 140/80 or less on average.  Reports a headache when her blood pressure goes high and feels that she has been feeling well lately.  She does still add some salt to certain foods but not much and tends to avoid it in most cases.  She does stay active as tolerated but is greatly limited by neuropathy in her lower extremities.  Has noted some wheezing at night recently but has no other symptoms.  Notes this is usually a precursor to getting bronchitis but currently has no congestion, cough, or shortness of breath.  Has a nebulizer at home and still has albuterol to use in it.  Considering getting this out so that she can use it as needed.   Objective:    Vitals:   02/02/23 0940  BP: (!) 175/76  Pulse: 66  Resp: 20  Height: 5' 8"$  (1.727 m)  Weight: 202 lb 12.8 oz (92 kg)  SpO2: 98%  BMI (Calculated): 30.84    Physical Exam Vitals and nursing note reviewed.  Constitutional:      General: She is not in acute distress.    Appearance: Normal appearance. She is not ill-appearing.  HENT:     Head: Normocephalic and atraumatic.  Cardiovascular:     Rate and Rhythm: Normal rate and regular rhythm.     Pulses: Normal pulses.     Heart sounds: Normal heart sounds.  Pulmonary:     Effort: Pulmonary effort is normal. No respiratory distress.     Breath sounds: Normal breath sounds. No wheezing, rhonchi or rales.   Skin:    General: Skin is warm and dry.  Neurological:     Mental Status: She is alert and oriented to person, place, and time.  Psychiatric:        Mood and Affect: Mood normal.        Behavior: Behavior normal.        Thought Content: Thought content normal.        Judgment: Judgment normal.   No results found for this or any previous visit (from the past 24 hour(s)).     The 10-year ASCVD risk score (Arnett DK, et al., 2019) is: 42.6%   Values used to calculate the score:     Age: 83 years     Sex: Female     Is Non-Hispanic African American: No     Diabetic: No     Tobacco smoker: No     Systolic Blood Pressure: 0000000 mmHg     Is BP treated: Yes     HDL Cholesterol: 51 mg/dL     Total Cholesterol: 249 mg/dL   Assessment & Plan:   1. Essential hypertension Checking labs as below.  Blood pressure elevated on arrival at 175/76.  Recheck of 186/76.  Continue olmesartan and HCTZ as prescribed.  Recommend reducing salt use and consider salt alternatives if [employer]  is desired.  Continue activity as tolerated.  Recommend monitoring blood pressure at home with a goal of 130/80 or less over the next 2 weeks.  Return for a nurse visit in 2 weeks bringing her blood pressure cuff and her readings log for verification of accuracy and review. If consistently elevated, we will need to make some medication adjustments at that time. - CBC with Differential/Platelet - COMPLETE METABOLIC PANEL WITH GFR - Lipid panel  2. Statin intolerance 3. Hypercholesterolemia Checking labs as below.  History of statin intolerance. - COMPLETE METABOLIC PANEL WITH GFR - Lipid panel  4. Stage 3a chronic kidney disease (HCC) Checking kidney function. - COMPLETE METABOLIC PANEL WITH GFR  Return in about 2 weeks (around 02/16/2023) for nurse visit for BP check.  ___________________________________________ Clearnce Sorrel, DNP, APRN, FNP-BC [facility] [place]

## 2023-02-03 DIAGNOSIS — D0461 Carcinoma in situ of skin of right upper limb, including shoulder: Secondary | ICD-10-CM | POA: Diagnosis not present

## 2023-02-03 DIAGNOSIS — L988 Other specified disorders of the skin and subcutaneous tissue: Secondary | ICD-10-CM | POA: Diagnosis not present

## 2023-02-03 LAB — CBC WITH DIFFERENTIAL/PLATELET
Absolute Monocytes: 439 cells/uL (ref 200–950)
Basophils Absolute: 49 cells/uL (ref 0–200)
Basophils Relative: 0.8 %
Eosinophils Absolute: 506 cells/uL — ABNORMAL HIGH (ref 15–500)
Eosinophils Relative: 8.3 %
HCT: 36.3 % (ref 35.0–45.0)
Hemoglobin: 12.1 g/dL (ref 11.7–15.5)
Lymphs Abs: 1531 cells/uL (ref 850–3900)
MCH: 29.2 pg (ref 27.0–33.0)
MCHC: 33.3 g/dL (ref 32.0–36.0)
MCV: 87.5 fL (ref 80.0–100.0)
MPV: 10.7 fL (ref 7.5–12.5)
Monocytes Relative: 7.2 %
Neutro Abs: 3575 cells/uL (ref 1500–7800)
Neutrophils Relative %: 58.6 %
Platelets: 257 10*3/uL (ref 140–400)
RBC: 4.15 10*6/uL (ref 3.80–5.10)
RDW: 12.9 % (ref 11.0–15.0)
Total Lymphocyte: 25.1 %
WBC: 6.1 10*3/uL (ref 3.8–10.8)

## 2023-02-03 LAB — COMPLETE METABOLIC PANEL WITH GFR
AG Ratio: 1.6 (calc) (ref 1.0–2.5)
ALT: 40 U/L — ABNORMAL HIGH (ref 6–29)
AST: 30 U/L (ref 10–35)
Albumin: 4.2 g/dL (ref 3.6–5.1)
Alkaline phosphatase (APISO): 106 U/L (ref 37–153)
BUN/Creatinine Ratio: 27 (calc) — ABNORMAL HIGH (ref 6–22)
BUN: 28 mg/dL — ABNORMAL HIGH (ref 7–25)
CO2: 29 mmol/L (ref 20–32)
Calcium: 10.1 mg/dL (ref 8.6–10.4)
Chloride: 102 mmol/L (ref 98–110)
Creat: 1.03 mg/dL — ABNORMAL HIGH (ref 0.60–1.00)
Globulin: 2.7 g/dL (calc) (ref 1.9–3.7)
Glucose, Bld: 87 mg/dL (ref 65–99)
Potassium: 4.4 mmol/L (ref 3.5–5.3)
Sodium: 141 mmol/L (ref 135–146)
Total Bilirubin: 0.5 mg/dL (ref 0.2–1.2)
Total Protein: 6.9 g/dL (ref 6.1–8.1)
eGFR: 56 mL/min/{1.73_m2} — ABNORMAL LOW (ref >=60)

## 2023-02-03 LAB — LIPID PANEL
Cholesterol: 249 mg/dL — ABNORMAL HIGH (ref <200)
HDL: 55 mg/dL (ref >=50)
LDL Cholesterol (Calc): 165 mg/dL (calc) — ABNORMAL HIGH
Non-HDL Cholesterol (Calc): 194 mg/dL (calc) — ABNORMAL HIGH (ref <130)
Total CHOL/HDL Ratio: 4.5 (calc) (ref <5.0)
Triglycerides: 144 mg/dL (ref <150)

## 2023-02-10 DIAGNOSIS — Z961 Presence of intraocular lens: Secondary | ICD-10-CM | POA: Diagnosis not present

## 2023-02-10 DIAGNOSIS — H401113 Primary open-angle glaucoma, right eye, severe stage: Secondary | ICD-10-CM | POA: Diagnosis not present

## 2023-02-10 DIAGNOSIS — H26492 Other secondary cataract, left eye: Secondary | ICD-10-CM | POA: Diagnosis not present

## 2023-02-10 DIAGNOSIS — H43812 Vitreous degeneration, left eye: Secondary | ICD-10-CM | POA: Diagnosis not present

## 2023-02-10 DIAGNOSIS — H0102B Squamous blepharitis left eye, upper and lower eyelids: Secondary | ICD-10-CM | POA: Diagnosis not present

## 2023-02-10 DIAGNOSIS — H401122 Primary open-angle glaucoma, left eye, moderate stage: Secondary | ICD-10-CM | POA: Diagnosis not present

## 2023-02-10 DIAGNOSIS — H0102A Squamous blepharitis right eye, upper and lower eyelids: Secondary | ICD-10-CM | POA: Diagnosis not present

## 2023-02-16 ENCOUNTER — Ambulatory Visit (INDEPENDENT_AMBULATORY_CARE_PROVIDER_SITE_OTHER): Payer: Medicare Other | Admitting: Medical-Surgical

## 2023-02-16 VITALS — BP 134/61 | HR 65 | Ht 68.0 in

## 2023-02-16 DIAGNOSIS — R399 Unspecified symptoms and signs involving the genitourinary system: Secondary | ICD-10-CM

## 2023-02-16 DIAGNOSIS — I10 Essential (primary) hypertension: Secondary | ICD-10-CM | POA: Diagnosis not present

## 2023-02-16 LAB — POCT URINALYSIS DIP (CLINITEK)

## 2023-02-16 NOTE — Progress Notes (Signed)
   Established Patient Office Visit  Subjective   Patient ID: Amy [name], female    DOB: 1946/09/12  Age: 77 y.o. MRN: [id number]  Chief Complaint  Patient presents with   Hypertension    BP check - nurse visit     HPI  Hypertension- nurse visit BP check -  patient  states she has been taking olmesartan and HCTZ without complication- denies chest pain, SOB, dizziness, or problems with medication. Patient brought in home cuff for comparison testing. Patient brought in home BP readings of 164/75, 179/75, 168/82, 160/73, 185/85,176/72, 162/72, 153/62, 150/68/160/74, 177/73, 183/73,   ROS    Objective:     BP (!) 153/61 (BP Location: Left Arm, Patient Position: Sitting, Cuff Size: Large)   Pulse 65   Ht 5' 8"$  (1.727 m)   SpO2 100%   BMI 30.84 kg/m    Physical Exam   No results found for any visits on 02/16/23.    The 10-year ASCVD risk score (Arnett DK, et al., 2019) is: 34.7%    Assessment & Plan:  Hypertension- BP check - initial reading = 153/61 ( home cuff reading = 157/78) . Second reading 134/61 (second home cuff reading = 154/78)- per Samuel Bouche, NP - continue on current medication regimen , change batteries in home cuff and keep home BP log- return in 3 months for Hyptertension follow up with Samuel Bouche ,NP - and bring in home cuff to this visit as well.  Problem List Items Addressed This Visit       Cardiovascular and Mediastinum   Essential hypertension - Primary    No follow-ups on file.    Rae Lips, LPN

## 2023-02-16 NOTE — Progress Notes (Signed)
Agree with documentation as below.  ___________________________________________ [name] L. Montrae Braithwaite, DNP, APRN, FNP-BC Primary [facility] Sports Medicine [facility]  

## 2023-02-26 DIAGNOSIS — H4089 Other specified glaucoma: Secondary | ICD-10-CM | POA: Diagnosis not present

## 2023-03-06 DIAGNOSIS — D485 Neoplasm of uncertain behavior of skin: Secondary | ICD-10-CM | POA: Diagnosis not present

## 2023-03-06 DIAGNOSIS — H61002 Unspecified perichondritis of left external ear: Secondary | ICD-10-CM | POA: Diagnosis not present

## 2023-03-06 DIAGNOSIS — L570 Actinic keratosis: Secondary | ICD-10-CM | POA: Diagnosis not present

## 2023-03-19 DIAGNOSIS — Z4889 Encounter for other specified surgical aftercare: Secondary | ICD-10-CM | POA: Diagnosis not present

## 2023-03-19 DIAGNOSIS — H906 Mixed conductive and sensorineural hearing loss, bilateral: Secondary | ICD-10-CM | POA: Diagnosis not present

## 2023-03-19 DIAGNOSIS — Z9889 Other specified postprocedural states: Secondary | ICD-10-CM | POA: Diagnosis not present

## 2023-03-19 DIAGNOSIS — Z79899 Other long term (current) drug therapy: Secondary | ICD-10-CM | POA: Diagnosis not present

## 2023-03-22 ENCOUNTER — Other Ambulatory Visit: Payer: Self-pay | Admitting: Medical-Surgical

## 2023-03-22 DIAGNOSIS — I10 Essential (primary) hypertension: Secondary | ICD-10-CM

## 2023-04-13 ENCOUNTER — Other Ambulatory Visit: Payer: Self-pay | Admitting: Medical-Surgical

## 2023-04-13 ENCOUNTER — Encounter: Payer: Self-pay | Admitting: Medical-Surgical

## 2023-04-13 MED ORDER — COLCHICINE 0.6 MG PO TABS: ORAL_TABLET | ORAL | 1 refills | Status: DC

## 2023-05-19 ENCOUNTER — Ambulatory Visit (INDEPENDENT_AMBULATORY_CARE_PROVIDER_SITE_OTHER): Payer: Medicare Other | Admitting: Medical-Surgical

## 2023-05-19 ENCOUNTER — Encounter: Payer: Self-pay | Admitting: Medical-Surgical

## 2023-05-19 DIAGNOSIS — I10 Essential (primary) hypertension: Secondary | ICD-10-CM

## 2023-05-19 MED ORDER — OLMESARTAN MEDOXOMIL 40 MG PO TABS: 40.0000 mg | ORAL_TABLET | Freq: Every day | ORAL | 1 refills | Status: DC

## 2023-05-19 MED ORDER — HYDROCHLOROTHIAZIDE 25 MG PO TABS: 25.0000 mg | ORAL_TABLET | Freq: Every day | ORAL | 1 refills | Status: DC

## 2023-05-19 NOTE — Progress Notes (Signed)
        Established patient visit  History, exam, impression, and plan:  1. Essential hypertension Pleasant 77 year old female presenting for follow up on HTN. Currently taking olmesartan 20 mg and HCTZ 25 mg daily, tolerating well without side effects.  Has blood pressure cuff at home but does not regularly monitor her blood pressure.  Preparation for this appointment, she reports that she did check her blood pressure and had readings ranging from 141-173/69-83 while at home.  Continues to work on a low-sodium diet and stays active although this is limited by significant neuropathy and orthopedic conditions.  Denies CP, SOB, palpitations, HA, dizziness, and syncopal episodes.  Does have some chronic lower extremity edema but this is manageable.  On exam, HRRR, S1/S2 normal.  Lungs CTA.  Respirations even and unlabored.  Mild bilateral lower extremity edema.  Blood pressure on arrival today 146/70.  Recheck at 137/69.  As her blood pressure here is fairly well-managed, would like to continue her current medications as prescribed and have her monitor her blood pressure at home.  If consistently elevated greater than 130/80, we will need to make some medication adjustments.  Discussed recommendations with patient and she is agreeable to monitor her blood pressure.   Procedures performed this visit: None.  Return in about 3 months (around 08/19/2023) for HTN follow up.  __________________________________ Thayer Ohm, DNP, APRN, FNP-BC Primary Care and Sports Medicine [facility] [place]

## 2023-06-17 DIAGNOSIS — Z86006 Personal history of melanoma in-situ: Secondary | ICD-10-CM | POA: Diagnosis not present

## 2023-06-17 DIAGNOSIS — D225 Melanocytic nevi of trunk: Secondary | ICD-10-CM | POA: Diagnosis not present

## 2023-06-17 DIAGNOSIS — D485 Neoplasm of uncertain behavior of skin: Secondary | ICD-10-CM | POA: Diagnosis not present

## 2023-06-17 DIAGNOSIS — C44622 Squamous cell carcinoma of skin of right upper limb, including shoulder: Secondary | ICD-10-CM | POA: Diagnosis not present

## 2023-06-17 DIAGNOSIS — L570 Actinic keratosis: Secondary | ICD-10-CM | POA: Diagnosis not present

## 2023-06-17 DIAGNOSIS — L578 Other skin changes due to chronic exposure to nonionizing radiation: Secondary | ICD-10-CM | POA: Diagnosis not present

## 2023-06-17 DIAGNOSIS — D2272 Melanocytic nevi of left lower limb, including hip: Secondary | ICD-10-CM | POA: Diagnosis not present

## 2023-06-17 DIAGNOSIS — L821 Other seborrheic keratosis: Secondary | ICD-10-CM | POA: Diagnosis not present

## 2023-06-17 DIAGNOSIS — Z86018 Personal history of other benign neoplasm: Secondary | ICD-10-CM | POA: Diagnosis not present

## 2023-06-17 DIAGNOSIS — Z85828 Personal history of other malignant neoplasm of skin: Secondary | ICD-10-CM | POA: Diagnosis not present

## 2023-06-19 DIAGNOSIS — Z1231 Encounter for screening mammogram for malignant neoplasm of breast: Secondary | ICD-10-CM | POA: Diagnosis not present

## 2023-08-06 DIAGNOSIS — D0511 Intraductal carcinoma in situ of right breast: Secondary | ICD-10-CM | POA: Diagnosis not present

## 2023-08-11 DIAGNOSIS — Z961 Presence of intraocular lens: Secondary | ICD-10-CM | POA: Diagnosis not present

## 2023-08-11 DIAGNOSIS — H43812 Vitreous degeneration, left eye: Secondary | ICD-10-CM | POA: Diagnosis not present

## 2023-08-11 DIAGNOSIS — H0102A Squamous blepharitis right eye, upper and lower eyelids: Secondary | ICD-10-CM | POA: Diagnosis not present

## 2023-08-11 DIAGNOSIS — H401113 Primary open-angle glaucoma, right eye, severe stage: Secondary | ICD-10-CM | POA: Diagnosis not present

## 2023-08-11 DIAGNOSIS — H401122 Primary open-angle glaucoma, left eye, moderate stage: Secondary | ICD-10-CM | POA: Diagnosis not present

## 2023-08-11 DIAGNOSIS — H0102B Squamous blepharitis left eye, upper and lower eyelids: Secondary | ICD-10-CM | POA: Diagnosis not present

## 2023-08-11 DIAGNOSIS — H26492 Other secondary cataract, left eye: Secondary | ICD-10-CM | POA: Diagnosis not present

## 2023-08-18 DIAGNOSIS — C44622 Squamous cell carcinoma of skin of right upper limb, including shoulder: Secondary | ICD-10-CM | POA: Diagnosis not present

## 2023-08-18 DIAGNOSIS — D0461 Carcinoma in situ of skin of right upper limb, including shoulder: Secondary | ICD-10-CM | POA: Diagnosis not present

## 2023-08-20 ENCOUNTER — Ambulatory Visit (INDEPENDENT_AMBULATORY_CARE_PROVIDER_SITE_OTHER): Payer: Medicare Other | Admitting: Medical-Surgical

## 2023-08-20 ENCOUNTER — Encounter: Payer: Self-pay | Admitting: Medical-Surgical

## 2023-08-20 VITALS — BP 137/75 | HR 64 | Resp 20 | Ht 68.0 in | Wt 198.3 lb

## 2023-08-20 DIAGNOSIS — I10 Essential (primary) hypertension: Secondary | ICD-10-CM

## 2023-08-20 DIAGNOSIS — C44622 Squamous cell carcinoma of skin of right upper limb, including shoulder: Secondary | ICD-10-CM | POA: Insufficient documentation

## 2023-08-20 MED ORDER — OLMESARTAN MEDOXOMIL 40 MG PO TABS: 40.0000 mg | ORAL_TABLET | Freq: Every day | ORAL | 1 refills | Status: DC

## 2023-08-20 MED ORDER — HYDROCHLOROTHIAZIDE 25 MG PO TABS: 25.0000 mg | ORAL_TABLET | Freq: Every day | ORAL | 1 refills | Status: DC

## 2023-08-20 NOTE — Progress Notes (Signed)
        Established patient visit  History, exam, impression, and plan:  1. Essential hypertension Amy [name] 77 year old female presenting today for follow-up on hypertension.  She is currently taking Benicar 40 mg daily and hydrochlorothiazide 25 mg daily.  Tolerating both medications without side effects.  Checking blood pressures at home and notes that her readings are usually in the 140/70-80s.  Follows a low-sodium diet most of the time.  Activity limited by neuropathy and mobility concerns but tries to stay physically active is much as possible. Denies CP, SOB, palpitations, lower extremity edema, dizziness, headaches, or vision changes.  HRRR, S1/S2 normal.  Lungs CTA with even and unlabored respirations.  Peripheral edema noted (chronic) in the lower extremities.  Blood pressure recheck at 137/75 which is at goal.  Continue olmesartan and HCTZ as prescribed. - hydrochlorothiazide (HYDRODIURIL) 25 MG tablet; Take 1 tablet (25 mg total) by mouth daily.  Dispense: 90 tablet; Refill: 1 - olmesartan (BENICAR) 40 MG tablet; Take 1 tablet (40 mg total) by mouth daily.  Dispense: 90 tablet; Refill: 1  Procedures performed this visit: None.  Return in about 6 months (around 02/19/2024) for chronic disease follow up.  __________________________________ Thayer Ohm, DNP, APRN, FNP-BC Primary Care and Sports Medicine [facility]

## 2023-09-03 DIAGNOSIS — L821 Other seborrheic keratosis: Secondary | ICD-10-CM | POA: Diagnosis not present

## 2023-09-03 DIAGNOSIS — Z85828 Personal history of other malignant neoplasm of skin: Secondary | ICD-10-CM | POA: Diagnosis not present

## 2023-09-03 DIAGNOSIS — D225 Melanocytic nevi of trunk: Secondary | ICD-10-CM | POA: Diagnosis not present

## 2023-09-03 DIAGNOSIS — L820 Inflamed seborrheic keratosis: Secondary | ICD-10-CM | POA: Diagnosis not present

## 2023-09-03 DIAGNOSIS — Z86006 Personal history of melanoma in-situ: Secondary | ICD-10-CM | POA: Diagnosis not present

## 2023-09-03 DIAGNOSIS — L578 Other skin changes due to chronic exposure to nonionizing radiation: Secondary | ICD-10-CM | POA: Diagnosis not present

## 2023-09-03 DIAGNOSIS — D485 Neoplasm of uncertain behavior of skin: Secondary | ICD-10-CM | POA: Diagnosis not present

## 2023-09-03 DIAGNOSIS — L570 Actinic keratosis: Secondary | ICD-10-CM | POA: Diagnosis not present

## 2023-09-03 DIAGNOSIS — Z86018 Personal history of other benign neoplasm: Secondary | ICD-10-CM | POA: Diagnosis not present

## 2023-09-03 DIAGNOSIS — D2272 Melanocytic nevi of left lower limb, including hip: Secondary | ICD-10-CM | POA: Diagnosis not present

## 2023-10-19 DIAGNOSIS — E7800 Pure hypercholesterolemia, unspecified: Secondary | ICD-10-CM | POA: Diagnosis not present

## 2023-10-19 DIAGNOSIS — I10 Essential (primary) hypertension: Secondary | ICD-10-CM | POA: Diagnosis not present

## 2023-10-19 DIAGNOSIS — Z1330 Encounter for screening examination for mental health and behavioral disorders, unspecified: Secondary | ICD-10-CM | POA: Diagnosis not present

## 2023-10-19 DIAGNOSIS — N1831 Chronic kidney disease, stage 3a: Secondary | ICD-10-CM | POA: Diagnosis not present

## 2023-12-02 DIAGNOSIS — L821 Other seborrheic keratosis: Secondary | ICD-10-CM | POA: Diagnosis not present

## 2023-12-02 DIAGNOSIS — D485 Neoplasm of uncertain behavior of skin: Secondary | ICD-10-CM | POA: Diagnosis not present

## 2023-12-02 DIAGNOSIS — D2272 Melanocytic nevi of left lower limb, including hip: Secondary | ICD-10-CM | POA: Diagnosis not present

## 2023-12-02 DIAGNOSIS — D045 Carcinoma in situ of skin of trunk: Secondary | ICD-10-CM | POA: Diagnosis not present

## 2023-12-02 DIAGNOSIS — Z85828 Personal history of other malignant neoplasm of skin: Secondary | ICD-10-CM | POA: Diagnosis not present

## 2023-12-02 DIAGNOSIS — L578 Other skin changes due to chronic exposure to nonionizing radiation: Secondary | ICD-10-CM | POA: Diagnosis not present

## 2023-12-02 DIAGNOSIS — D225 Melanocytic nevi of trunk: Secondary | ICD-10-CM | POA: Diagnosis not present

## 2023-12-02 DIAGNOSIS — D0461 Carcinoma in situ of skin of right upper limb, including shoulder: Secondary | ICD-10-CM | POA: Diagnosis not present

## 2023-12-02 DIAGNOSIS — L570 Actinic keratosis: Secondary | ICD-10-CM | POA: Diagnosis not present

## 2023-12-02 DIAGNOSIS — C44229 Squamous cell carcinoma of skin of left ear and external auricular canal: Secondary | ICD-10-CM | POA: Diagnosis not present

## 2023-12-02 DIAGNOSIS — L814 Other melanin hyperpigmentation: Secondary | ICD-10-CM | POA: Diagnosis not present

## 2023-12-02 DIAGNOSIS — Z86006 Personal history of melanoma in-situ: Secondary | ICD-10-CM | POA: Diagnosis not present

## 2023-12-02 DIAGNOSIS — Z86018 Personal history of other benign neoplasm: Secondary | ICD-10-CM | POA: Diagnosis not present

## 2023-12-02 DIAGNOSIS — L719 Rosacea, unspecified: Secondary | ICD-10-CM | POA: Diagnosis not present

## 2023-12-19 ENCOUNTER — Ambulatory Visit
Admission: EM | Admit: 2023-12-19 | Discharge: 2023-12-19 | Disposition: A | Discharge status: Home / Self Care | Payer: Medicare Other | Attending: Family Medicine | Admitting: Family Medicine

## 2023-12-19 ENCOUNTER — Other Ambulatory Visit: Payer: Self-pay

## 2023-12-19 DIAGNOSIS — J40 Bronchitis, not specified as acute or chronic: Secondary | ICD-10-CM | POA: Diagnosis not present

## 2023-12-19 DIAGNOSIS — R059 Cough, unspecified: Secondary | ICD-10-CM | POA: Diagnosis not present

## 2023-12-19 LAB — POC SARS CORONAVIRUS 2 AG -  ED: SARS Coronavirus 2 Ag: NEGATIVE

## 2023-12-19 LAB — POC INFLUENZA A AND B ANTIGEN (URGENT CARE ONLY)
Influenza A Ag: NEGATIVE
Influenza B Ag: NEGATIVE

## 2023-12-19 MED ORDER — BENZONATATE 200 MG PO CAPS: 200.0000 mg | ORAL_CAPSULE | Freq: Three times a day (TID) | ORAL | 0 refills | Status: AC | PRN

## 2023-12-19 MED ORDER — HYDROCOD POLI-CHLORPHE POLI ER 10-8 MG/5ML PO SUER: 5.0000 mL | Freq: Every evening | ORAL | 0 refills | Status: DC | PRN

## 2023-12-19 MED ORDER — AZITHROMYCIN 250 MG PO TABS: 250.0000 mg | ORAL_TABLET | Freq: Every day | ORAL | 0 refills | Status: DC

## 2023-12-19 NOTE — ED Provider Notes (Signed)
[facility]    CSN: [id number] Arrival date & time: 12/19/23  0813      History   Chief Complaint Chief Complaint  Patient presents with   Cough    HPI Amy [name] is a 77 y.o. female.   HPI pleasant 77 year old female presents with cough and congestion since yesterday reports COVID 19 test was last night.  Patient request influenza testing as she is taking care of her husband currently.  PMH significant for HTN, hypercholesterolemia, and SCC of right forearm.  Past Medical History:  Diagnosis Date   Allergy 1977   Face swelling   Arthritis    Breast cancer (HCC)    Bronchitis    Glaucoma Several years   Severe   History of right breast cancer 12/22/2016   History of tympanoplasty 03/09/2020   Hypercholesterolemia 10/10/2019   Hypertension    Neuropathy 10/11/2020   Skin cancer    Stage 3 chronic kidney disease (HCC) 10/10/2019    Patient Active Problem List   Diagnosis Date Noted   Squamous cell cancer of skin of right forearm 08/20/2023   Statin intolerance 10/15/2022   Melanocytic nevi of trunk 07/31/2022   Benign hypertensive renal disease 06/19/2021   Neck pain 06/19/2021   Gout 10/11/2020   Neuropathy 10/11/2020   Glaucoma 08/09/2020   Mixed conductive and sensorineural hearing loss of both ears 03/09/2020   Sensorineural hearing loss (SNHL) of left ear with restricted hearing of right ear 03/09/2020   Essential hypertension 10/10/2019   Hypercholesterolemia 10/10/2019   Stage 3 chronic kidney disease (HCC) 10/10/2019   Body mass index (BMI) of 25.0 to 29.9 04/25/2019   Menopausal syndrome 04/25/2019   Ductal carcinoma in situ (DCIS) of right breast 05/08/2017    Past Surgical History:  Procedure Laterality Date   ABDOMINAL HYSTERECTOMY     APPENDECTOMY     BREAST SURGERY  April 2018   CESAREAN SECTION  07/1976   CESAREAN SECTION  03/1979   COSMETIC SURGERY  2017   EYE SURGERY  09-2016/01-2017   Glaucoma/Cataract surgery    FACIAL COSMETIC SURGERY     "eye lid surgery"   GLAUCOMA SURGERY     JOINT REPLACEMENT     KNEE ARTHROSCOPY Bilateral    torn meniscus on bilateral knees   MASTECTOMY Right    MASTECTOMY COMPLETE / SIMPLE W/ SENTINEL NODE BIOPSY Right 05/08/2017   MASTECTOMY W/ SENTINEL NODE BIOPSY Right 05/08/2017   Procedure: RIGHT MASTECTOMY WITH SENTINEL LYMPH NODE BIOPSY;  Surgeon: Griselda Miner, MD;  Location: [facility] OR;  Service: General;  Laterality: Right;   NOSE SURGERY     deviated septum    titanium ear implant Left    "for hearing" left, Dr. Dorma Russell   TUBAL LIGATION      OB History   No obstetric history on file.      Home Medications    Prior to Admission medications   Medication Sig Start Date End Date Taking? Authorizing Provider  azithromycin (ZITHROMAX) 250 MG tablet Take 1 tablet (250 mg total) by mouth daily. Take first 2 tablets together, then 1 every day until finished. 12/19/23  Yes Trevor Iha, FNP  benzonatate (TESSALON) 200 MG capsule Take 1 capsule (200 mg total) by mouth 3 (three) times daily as needed for up to 7 days. 12/19/23 12/26/23 Yes Trevor Iha, FNP  chlorpheniramine-HYDROcodone (TUSSIONEX) 10-8 MG/5ML Take 5 mLs by mouth at bedtime as needed for cough. 12/19/23  Yes Trevor Iha, FNP  albuterol (PROVENTIL) (2.5 MG/3ML) 0.083% nebulizer solution Take 3 mLs (2.5 mg total) by nebulization every 6 (six) hours as needed for wheezing or shortness of breath. 10/05/22   Eustace Moore, MD  colchicine 0.6 MG tablet Take 2 tablets (1.2mg ) now then take 1 tablet (0.6mg ) one hour later. Can take 1 tablet (0.6mg ) twice daily as needed until flare has resolved. 04/13/23   Christen Butter, NP  dorzolamide-timolol (COSOPT) 22.3-6.8 MG/ML ophthalmic solution Place 1 drop into both eyes 2 (two) times daily.    [provider]  hydrochlorothiazide (HYDRODIURIL) 25 MG tablet Take 1 tablet (25 mg total) by mouth daily. 08/20/23   Christen Butter, NP  latanoprost (XALATAN) 0.005  % ophthalmic solution Place 1 drop into both eyes at bedtime.    [provider]  olmesartan (BENICAR) 40 MG tablet Take 1 tablet (40 mg total) by mouth daily. 08/20/23   Christen Butter, NP  triamcinolone cream (KENALOG) 0.1 % SMARTSIG:Sparingly Topical Twice Daily 11/11/22   [provider]    Family History Family History  Problem Relation Age of Onset   Hypertension Mother    Skin cancer Father    Prostate cancer Father    Cancer Father 107   Breast cancer Sister    Cancer Sister 81    Social History Social History   Tobacco Use   Smoking status: Never   Smokeless tobacco: Never  Vaping Use   Vaping status: Never Used  Substance Use Topics   Alcohol use: Yes    Alcohol/week: 1.0 standard drink of alcohol    Types: 1 Glasses of wine per week    Comment: Very little alcohol use   Drug use: No     Allergies   Meperidine, Gabapentin, Rosuvastatin, Tape, Tapentadol, and Methylprednisolone   Review of Systems Review of Systems  Respiratory:  Positive for cough.   All other systems reviewed and are negative.    Physical Exam Triage Vital Signs ED Triage Vitals  Encounter Vitals Group     BP      Systolic BP Percentile      Diastolic BP Percentile      Pulse      Resp      Temp      Temp src      SpO2      Weight      Height      Head Circumference      Peak Flow      Pain Score      Pain Loc      Pain Education      Exclude from Growth Chart    No data found.  Updated Vital Signs BP (!) 187/89 (BP Location: Left Arm)   Pulse 81   Temp 99 F (37.2 C) (Oral)   Resp 17   SpO2 99%       Physical Exam Vitals and nursing note reviewed.  Constitutional:      Appearance: Normal appearance. She is obese. She is ill-appearing.  HENT:     Head: Normocephalic and atraumatic.     Right Ear: Tympanic membrane, ear canal and external ear normal.     Left Ear: Tympanic membrane, ear canal and external ear normal.     Mouth/Throat:      Mouth: Mucous membranes are moist.     Pharynx: Oropharynx is clear.  Eyes:     Extraocular Movements: Extraocular movements intact.     Conjunctiva/sclera: Conjunctivae normal.  Pupils: Pupils are equal, round, and reactive to light.  Cardiovascular:     Rate and Rhythm: Normal rate and regular rhythm.     Pulses: Normal pulses.     Heart sounds: Normal heart sounds.  Pulmonary:     Effort: Pulmonary effort is normal.     Breath sounds: Rhonchi present. No wheezing or rales.     Comments: Diffuse scattered rhonchi noted throughout with frequent nonproductive cough on exam Musculoskeletal:        General: Normal range of motion.     Cervical back: Normal range of motion and neck supple.  Skin:    General: Skin is warm and dry.  Neurological:     General: No focal deficit present.     Mental Status: She is alert and oriented to person, place, and time.  Psychiatric:        Mood and Affect: Mood normal.        Behavior: Behavior normal.      UC Treatments / Results  Labs (all labs ordered are listed, but only abnormal results are displayed) Labs Reviewed  POC INFLUENZA A AND B ANTIGEN (URGENT CARE ONLY) - Normal  POC SARS CORONAVIRUS 2 AG -  ED    EKG   Radiology No results found.  Procedures Procedures (including critical care time)  Medications Ordered in UC Medications - No data to display  Initial Impression / Assessment and Plan / UC Course  I have reviewed the triage vital signs and the nursing notes.  Pertinent labs & imaging results that were available during my care of the patient were reviewed by me and considered in my medical decision making (see chart for details).     MDM: 1.  Bronchitis-Rx'd Zithromax (500 mg day 1, then 250 mg day 2-5); 2.  Cough, unspecified type-Rx'd Tessalon 200 mg capsules: Take 1 capsule 3 times daily, as needed for cough.,  Tussionex 10-8 MG/5 mL syrup: Take 5 mL nightly for cough, as needed. Advised patient to take  medications as directed with food to completion.  Advised may take Tessalon capsules daily or as needed for cough.  Advised may use Tussionex for cough prior to sleep due to sedative effects.  Encouraged to increase daily water intake to 64 ounces per day while taking these medications.  Advised if symptoms worsen and/or unresolved please follow-up PCP or here for further evaluation.  Patient discharged home, hemodynamically stable. Final Clinical Impressions(s) / UC Diagnoses   Final diagnoses:  Bronchitis  Cough, unspecified type     Discharge Instructions      Advised patient to take medications as directed with food to completion.  Advised may take Tessalon capsules daily or as needed for cough.  Advised may use Tussionex for cough prior to sleep due to sedative effects.  Encouraged to increase daily water intake to 64 ounces per day while taking these medications.  Advised if symptoms worsen and/or unresolved please follow-up PCP or here for further evaluation.     ED Prescriptions     Medication Sig Dispense Auth. Provider   azithromycin (ZITHROMAX) 250 MG tablet Take 1 tablet (250 mg total) by mouth daily. Take first 2 tablets together, then 1 every day until finished. 6 tablet Trevor Iha, FNP   chlorpheniramine-HYDROcodone (TUSSIONEX) 10-8 MG/5ML Take 5 mLs by mouth at bedtime as needed for cough. 115 mL Trevor Iha, FNP   benzonatate (TESSALON) 200 MG capsule Take 1 capsule (200 mg total) by mouth 3 (three) times daily  as needed for up to 7 days. 40 capsule Trevor Iha, FNP      I have reviewed the PDMP during this encounter.   Trevor Iha, FNP 12/19/23 [phone]

## 2023-12-19 NOTE — Discharge Instructions (Addendum)
Advised patient to take medications as directed with food to completion.  Advised may take Tessalon capsules daily or as needed for cough.  Advised may use Tussionex for cough prior to sleep due to sedative effects.  Encouraged to increase daily water intake to 64 ounces per day while taking these medications.  Advised if symptoms worsen and/or unresolved please follow-up PCP or here for further evaluation.

## 2023-12-19 NOTE — ED Triage Notes (Signed)
Pt c/o cough and congestion since yesterday. COVID test neg last night. Would like covid and flu testing since she's taking care of her husband. Airborne prn.

## 2024-01-05 DIAGNOSIS — D0461 Carcinoma in situ of skin of right upper limb, including shoulder: Secondary | ICD-10-CM | POA: Diagnosis not present

## 2024-01-18 ENCOUNTER — Ambulatory Visit (INDEPENDENT_AMBULATORY_CARE_PROVIDER_SITE_OTHER): Payer: Medicare Other | Admitting: Family Medicine

## 2024-01-18 ENCOUNTER — Encounter: Payer: Self-pay | Admitting: Family Medicine

## 2024-01-18 VITALS — Ht 68.0 in | Wt 196.0 lb

## 2024-01-18 DIAGNOSIS — Z0000 Encounter for general adult medical examination without abnormal findings: Secondary | ICD-10-CM | POA: Diagnosis not present

## 2024-01-18 NOTE — Progress Notes (Signed)
Subjective:   Amy [name] is a 78 y.o. female who presents for Medicare Annual (Subsequent) preventive examination.  Visit Complete: Virtual I connected with  Amy [name] on 01/18/24 by a audio enabled telemedicine application and verified that I am speaking with the correct person using two identifiers.  Patient Location: Home  Provider Location: Office/Clinic  I discussed the limitations of evaluation and management by telemedicine. The patient expressed understanding and agreed to proceed.  Vital Signs: Because this visit was a virtual/telehealth visit, some criteria may be missing or patient reported. Any vitals not documented were not able to be obtained and vitals that have been documented are patient reported.  Patient Medicare AWV questionnaire was completed by the patient on 01/14/24; I have confirmed that all information answered by patient is correct and no changes since this date.  Cardiac Risk Factors include: advanced age (>76men, >16 women);dyslipidemia;hypertension     Objective:    Today's Vitals   01/18/24 1107  Weight: 196 lb (88.9 kg)  Height: 5\' 8"  (1.727 m)   Body mass index is 29.8 kg/m.     01/18/2024   11:18 AM 01/12/2023    9:44 AM 01/10/2022    8:45 AM 06/19/2021    4:00 PM 07/10/2017    9:03 AM 05/08/2017    6:04 PM 04/27/2017    1:11 PM  Advanced Directives  Does Patient Have a Medical Advance Directive? Yes Yes Yes Yes Yes Yes No  Type of Estate agent of [place];Living will Living will Healthcare Power of [place];Living will Healthcare Power of [place];Living will;Out of facility DNR (pink MOST or yellow form)  Healthcare Power of [place];Living will   Does patient want to make changes to medical advance directive? No - Patient declined No - Patient declined No - Patient declined No - Patient declined  No - Patient declined   Copy of Healthcare Power of Attorney in Chart?   Yes - validated most recent copy  scanned in chart (See row information) No - copy requested  Yes   Would patient like information on creating a medical advance directive?       Yes (MAU/Ambulatory/Procedural Areas - Information given)    Current Medications (verified) Outpatient Encounter Medications as of 01/18/2024  Medication Sig   colchicine 0.6 MG tablet Take 2 tablets (1.2mg ) now then take 1 tablet (0.6mg ) one hour later. Can take 1 tablet (0.6mg ) twice daily as needed until flare has resolved.   dorzolamide-timolol (COSOPT) 22.3-6.8 MG/ML ophthalmic solution Place 1 drop into both eyes 2 (two) times daily.   hydrochlorothiazide (HYDRODIURIL) 25 MG tablet Take 1 tablet (25 mg total) by mouth daily.   latanoprost (XALATAN) 0.005 % ophthalmic solution Place 1 drop into both eyes at bedtime.   olmesartan (BENICAR) 40 MG tablet Take 1 tablet (40 mg total) by mouth daily.   triamcinolone cream (KENALOG) 0.1 % SMARTSIG:Sparingly Topical Twice Daily   albuterol (PROVENTIL) (2.5 MG/3ML) 0.083% nebulizer solution Take 3 mLs (2.5 mg total) by nebulization every 6 (six) hours as needed for wheezing or shortness of breath.   azithromycin (ZITHROMAX) 250 MG tablet Take 1 tablet (250 mg total) by mouth daily. Take first 2 tablets together, then 1 every day until finished.   chlorpheniramine-HYDROcodone (TUSSIONEX) 10-8 MG/5ML Take 5 mLs by mouth at bedtime as needed for cough.   No facility-administered encounter medications on file as of 01/18/2024.    Allergies (verified) Meperidine, Gabapentin, Rosuvastatin, Tape, Tapentadol, and Methylprednisolone   History: Past  Medical History:  Diagnosis Date   Allergy 1977   Face swelling   Arthritis    Breast cancer (HCC)    Bronchitis    Glaucoma Several years   Severe   History of right breast cancer 12/22/2016   History of tympanoplasty 03/09/2020   Hypercholesterolemia 10/10/2019   Hypertension    Neuropathy 10/11/2020   Skin cancer    Stage 3 chronic kidney disease (HCC)  10/10/2019   Past Surgical History:  Procedure Laterality Date   ABDOMINAL HYSTERECTOMY     APPENDECTOMY     BREAST SURGERY  April 2018   CESAREAN SECTION  07/1976   CESAREAN SECTION  03/1979   COSMETIC SURGERY  2017   EYE SURGERY  09-2016/01-2017   Glaucoma/Cataract surgery   FACIAL COSMETIC SURGERY     "eye lid surgery"   GLAUCOMA SURGERY     JOINT REPLACEMENT     KNEE ARTHROSCOPY Bilateral    torn meniscus on bilateral knees   MASTECTOMY Right    MASTECTOMY COMPLETE / SIMPLE W/ SENTINEL NODE BIOPSY Right 05/08/2017   MASTECTOMY W/ SENTINEL NODE BIOPSY Right 05/08/2017   Procedure: RIGHT MASTECTOMY WITH SENTINEL LYMPH NODE BIOPSY;  Surgeon: Griselda Miner, MD;  Location: [facility] OR;  Service: General;  Laterality: Right;   NOSE SURGERY     deviated septum    titanium ear implant Left    "for hearing" left, Dr. Dorma Russell   TUBAL LIGATION     Family History  Problem Relation Age of Onset   Hypertension Mother    Skin cancer Father    Prostate cancer Father    Cancer Father 31   Breast cancer Sister    Cancer Sister 34   Social History   Socioeconomic History   Marital status: Married    Spouse name: Urania Pearlman   Number of children: 4   Years of education: 12   Highest education level: 12th grade  Occupational History   Occupation: Retired  Tobacco Use   Smoking status: Never   Smokeless tobacco: Never  Vaping Use   Vaping status: Never Used  Substance and Sexual Activity   Alcohol use: Yes    Alcohol/week: 1.0 standard drink of alcohol    Types: 1 Glasses of wine per week    Comment: Very little alcohol use   Drug use: No   Sexual activity: Not Currently    Partners: Male    Birth control/protection: None  Other Topics Concern   Not on file  Social History Narrative   Lives with husband. She has four children. She enjoys going to antique shopping and stays active.   Social Drivers of Corporate investment banker Strain: Low Risk  (01/18/2024)   Overall  Financial Resource Strain (CARDIA)    Difficulty of Paying Living Expenses: Not hard at all  Food Insecurity: No Food Insecurity (01/18/2024)   Hunger Vital Sign    Worried About Running Out of Food in the Last Year: Never true    Ran Out of Food in the Last Year: Never true  Transportation Needs: No Transportation Needs (01/18/2024)   PRAPARE - Administrator, Civil Service (Medical): No    Lack of Transportation (Non-Medical): No  Physical Activity: Insufficiently Active (01/18/2024)   Exercise Vital Sign    Days of Exercise per Week: 3 days    Minutes of Exercise per Session: 10 min  Stress: No Stress Concern Present (01/18/2024)   [employer] of Occupational Health -  Occupational Stress Questionnaire    Feeling of Stress : Not at all  Social Connections: Moderately Integrated (01/18/2024)   Social Connection and Isolation Panel [NHANES]    Frequency of Communication with Friends and Family: More than three times a week    Frequency of Social Gatherings with Friends and Family: Three times a week    Attends Religious Services: More than 4 times per year    Active Member of Clubs or Organizations: No    Attends Banker Meetings: Never    Marital Status: Married    Tobacco Counseling Counseling given: Not Answered   Clinical Intake:  Pre-visit preparation completed: No  Pain : No/denies pain     BMI - recorded: 29.8 Nutritional Status: BMI 25 -29 Overweight Nutritional Risks: None Diabetes: No  How often do you need to have someone help you when you read instructions, pamphlets, or other written materials from your doctor or pharmacy?: 1 - Never What is the last grade level you completed in school?: 12  Interpreter Needed?: No      Activities of Daily Living    01/18/2024   11:11 AM 01/14/2024    9:26 AM  In your present state of health, do you have any difficulty performing the following activities:  Hearing? 1 1  Comment wears  implant   Vision? 0 0  Difficulty concentrating or making decisions? 0 0  Walking or climbing stairs? 1 1  Dressing or bathing?  0  Doing errands, shopping? 0 0  Preparing Food and eating ? N N  Using the Toilet? N N  In the past six months, have you accidently leaked urine? N N  Do you have problems with loss of bowel control? N N  Managing your Medications? N N  Managing your Finances? N N  Housekeeping or managing your Housekeeping? N N    Patient Care Team: Christen Butter, NP as PCP - General (Nurse Practitioner) Griselda Miner, MD as Consulting Physician (General Surgery) Dione Booze, MD opthalmology Ermalinda Barrios, MD ENT Elmon Else, MD Melchor Amour, MD GYN     Indicate any recent Medical Services you may have received from other than [facility] providers in the past year (date may be approximate).     Assessment:   This is a routine wellness examination for [place].  Hearing/Vision screen Hearing Screening - Comments:: Unable to test, grossly intact. Does not wear  hearing aids.  Vision Screening - Comments:: Unable to test, wears reading glasses.    Goals Addressed             This Visit's Progress    Disease Progression Prevented or Minimized       Maintain her current lifestyle, going to beach when desired.       Depression Screen    01/18/2024   11:16 AM 01/12/2023    9:47 AM 07/31/2022    1:53 PM 01/22/2022   11:14 AM 01/10/2022    8:48 AM 06/19/2021    4:00 PM  PHQ 2/9 Scores  PHQ - 2 Score 0 0 0 0 0 0  PHQ- 9 Score      0    Fall Risk    01/18/2024   11:19 AM 01/14/2024    9:26 AM 01/12/2023    9:44 AM 01/08/2023    3:43 PM 07/31/2022    1:53 PM  Fall Risk   Falls in the past year? 0 0 0 0 0  Number falls in past yr: 0  0 0 0  Injury with Fall? 0  0  0  Risk for fall due to : No Fall Risks  No Fall Risks  No Fall Risks  Follow up   Falls evaluation completed  Falls evaluation completed    MEDICARE RISK AT HOME: Medicare Risk at Home Any stairs in  or around the home?: No If so, are there any without handrails?: No Home free of loose throw rugs in walkways, pet beds, electrical cords, etc?: Yes Adequate lighting in your home to reduce risk of falls?: Yes Life alert?: No Use of a cane, walker or w/c?: Yes Grab bars in the bathroom?: No Shower chair or bench in shower?: Yes Elevated toilet seat or a handicapped toilet?: Yes  TIMED UP AND GO:  Was the test performed?  No    Cognitive Function:        01/18/2024   11:20 AM 01/12/2023    9:49 AM 01/10/2022    8:55 AM  6CIT Screen  What Year? 0 points 0 points 0 points  What month? 0 points 0 points 0 points  What time? 0 points 0 points 0 points  Count back from 20 0 points 0 points 0 points  Months in reverse 0 points  0 points  Repeat phrase 0 points  0 points  Total Score 0 points  0 points    Immunizations Immunization History  Administered Date(s) Administered   Pneumococcal Conjugate-13 03/23/2015, 04/12/2015   Pneumococcal Polysaccharide-23 04/04/2011, 04/04/2011, 03/23/2015, 03/23/2015   Pneumococcal-Unspecified 03/22/2012   Td 03/01/1999   Tdap 03/23/2011, 04/04/2011, 06/19/2021    TDAP status: Up to date  Flu Vaccine status: Declined, Education has been provided regarding the importance of this vaccine but patient still declined. Advised may receive this vaccine at local pharmacy or Health Dept. Aware to provide a copy of the vaccination record if obtained from local pharmacy or Health Dept. Verbalized acceptance and understanding.  Pneumococcal vaccine status: Up to date  Covid-19 vaccine status: Declined, Education has been provided regarding the importance of this vaccine but patient still declined. Advised may receive this vaccine at local pharmacy or Health Dept.or vaccine clinic. Aware to provide a copy of the vaccination record if obtained from local pharmacy or Health Dept. Verbalized acceptance and understanding.  Qualifies for Shingles Vaccine? Yes    Zostavax completed No   Shingrix Completed?: No.    Education has been provided regarding the importance of this vaccine. Patient has been advised to call insurance company to determine out of pocket expense if they have not yet received this vaccine. Advised may also receive vaccine at local pharmacy or Health Dept. Verbalized acceptance and understanding.  Screening Tests Health Maintenance  Topic Date Due   COVID-19 Vaccine (1) Never done   Hepatitis C Screening  Never done   Zoster Vaccines- Shingrix (1 of 2) Never done   INFLUENZA VACCINE  03/21/2024 (Originally 07/23/2023)   Medicare Annual Wellness (AWV)  01/17/2025   Colonoscopy  01/02/2028   DTaP/Tdap/Td (5 - Td or Tdap) 06/20/2031   Pneumonia Vaccine 68+ Years old  Completed   DEXA SCAN  Completed   HPV VACCINES  Aged Out    Health Maintenance  Health Maintenance Due  Topic Date Due   COVID-19 Vaccine (1) Never done   Hepatitis C Screening  Never done   Zoster Vaccines- Shingrix (1 of 2) Never done    Colorectal cancer screening: No longer required.   Mammogram status: No longer required due to  age. Reports getting mammogram in May 2025.   Bone Density status: Completed 09/23/2021. Results reflect: Bone density results: OSTEOPENIA. Repeat every 2-3 years.  Lung Cancer Screening: (Low Dose CT Chest recommended if Age 52-80 years, 20 pack-year currently smoking OR have quit w/in 15years.) does not qualify.   Lung Cancer Screening Referral: n/a   Additional Screening:  Hepatitis C Screening: does qualify; Completed n/a   Vision Screening: Recommended annual ophthalmology exams for early detection of glaucoma and other disorders of the eye. Is the patient up to date with their annual eye exam?  Yes  Who is the provider or what is the name of the office in which the patient attends annual eye exams? Dione Booze, MD  If pt is not established with a provider, would they like to be referred to a provider to establish care? No .    Dental Screening: Recommended annual dental exams for proper oral hygiene  Diabetic Foot Exam: n/a   Community Resource Referral / Chronic Care Management: CRR required this visit?  No   CCM required this visit?  No     Plan:     I have personally reviewed and noted the following in the patient's chart:   Medical and social history Use of alcohol, tobacco or illicit drugs  Current medications and supplements including opioid prescriptions. Patient is not currently taking opioid prescriptions. Functional ability and status Nutritional status Physical activity Advanced directives List of other physicians Hospitalizations, surgeries, and ER visits in previous 12 months: none  Vitals:  unable to perform (virtual).  Screenings to include cognitive, depression, and falls Referrals and appointments: none today.   In addition, I have reviewed and discussed with patient certain preventive protocols, quality metrics, and best practice recommendations. A written personalized care plan for preventive services as well as general preventive health recommendations were provided to patient.     Novella Olive, FNP   01/18/2024   After Visit Summary: (MyChart) Due to this being a telephonic visit, the after visit summary with patients personalized plan was offered to patient via MyChart   Follow-up with PCP as scheduled.  Shingrix at local pharmacy.  Declines need for influenza and Covid vaccines.  Aged out for colonoscopy. Continues to get mammograms, due in May 2025.

## 2024-01-26 DIAGNOSIS — C4442 Squamous cell carcinoma of skin of scalp and neck: Secondary | ICD-10-CM | POA: Diagnosis not present

## 2024-02-19 ENCOUNTER — Ambulatory Visit: Payer: Medicare Other | Admitting: Medical-Surgical

## 2024-02-24 ENCOUNTER — Other Ambulatory Visit: Payer: Self-pay | Admitting: Medical-Surgical

## 2024-02-24 DIAGNOSIS — H401113 Primary open-angle glaucoma, right eye, severe stage: Secondary | ICD-10-CM | POA: Diagnosis not present

## 2024-02-24 DIAGNOSIS — H401122 Primary open-angle glaucoma, left eye, moderate stage: Secondary | ICD-10-CM | POA: Diagnosis not present

## 2024-02-24 DIAGNOSIS — H0102A Squamous blepharitis right eye, upper and lower eyelids: Secondary | ICD-10-CM | POA: Diagnosis not present

## 2024-02-24 DIAGNOSIS — Z961 Presence of intraocular lens: Secondary | ICD-10-CM | POA: Diagnosis not present

## 2024-02-24 DIAGNOSIS — H0102B Squamous blepharitis left eye, upper and lower eyelids: Secondary | ICD-10-CM | POA: Diagnosis not present

## 2024-02-24 DIAGNOSIS — H43812 Vitreous degeneration, left eye: Secondary | ICD-10-CM | POA: Diagnosis not present

## 2024-02-24 DIAGNOSIS — I10 Essential (primary) hypertension: Secondary | ICD-10-CM

## 2024-02-24 DIAGNOSIS — H26492 Other secondary cataract, left eye: Secondary | ICD-10-CM | POA: Diagnosis not present

## 2024-03-08 ENCOUNTER — Encounter: Payer: Self-pay | Admitting: Medical-Surgical

## 2024-03-08 ENCOUNTER — Ambulatory Visit (INDEPENDENT_AMBULATORY_CARE_PROVIDER_SITE_OTHER): Payer: Medicare Other | Admitting: Medical-Surgical

## 2024-03-08 VITALS — BP 160/70 | HR 73 | Resp 20 | Ht 68.0 in | Wt 202.4 lb

## 2024-03-08 DIAGNOSIS — I10 Essential (primary) hypertension: Secondary | ICD-10-CM | POA: Diagnosis not present

## 2024-03-08 DIAGNOSIS — L299 Pruritus, unspecified: Secondary | ICD-10-CM | POA: Diagnosis not present

## 2024-03-08 DIAGNOSIS — E7800 Pure hypercholesterolemia, unspecified: Secondary | ICD-10-CM | POA: Diagnosis not present

## 2024-03-08 DIAGNOSIS — N183 Chronic kidney disease, stage 3 unspecified: Secondary | ICD-10-CM

## 2024-03-08 DIAGNOSIS — Z789 Other specified health status: Secondary | ICD-10-CM

## 2024-03-08 DIAGNOSIS — M1A30X0 Chronic gout due to renal impairment, unspecified site, without tophus (tophi): Secondary | ICD-10-CM | POA: Diagnosis not present

## 2024-03-08 MED ORDER — TRIAMCINOLONE ACETONIDE 0.1 % EX CREA: TOPICAL_CREAM | Freq: Two times a day (BID) | CUTANEOUS | 0 refills | Status: AC

## 2024-03-08 MED ORDER — LOSARTAN POTASSIUM-HCTZ 100-25 MG PO TABS: 1.0000 | ORAL_TABLET | Freq: Every day | ORAL | 3 refills | Status: DC

## 2024-03-08 NOTE — Progress Notes (Unsigned)
 Subjective:  Patient ID: Amy [name], female    DOB: [birth year], 78 y.o.   MRN: [id number]  Patient Care Team: Christen Butter, NP as PCP - General (Nurse Practitioner) Griselda Miner, MD as Consulting Physician (General Surgery)   Chief Complaint:  Hypertension   HPI:  Amy [name] is a 78 y.o. female presenting on 03/08/2024 for Hypertension   History, Exam,  Impression and Plan HPI  1. Essential hypertension (Primary) Patient present for management of chronic conditions.  Patient taking olmesartan 40 mg daily and HCTZ 25 mg daily.  Patient reports home blood pressure is averages 140s over 80s.  Reports being able to feel when blood pressure gets into 160s systolic.  Patient has concerns about medication regimen and is worried that a known side effect of olmesartan is itching and may be responsible for patient's chronic itching.  Also concerned that her blood pressure was better historically when she was taking her losartan and HCTZ.  Patient had to stop losartan when was recalled several years ago. Blood pressure was 155/78 today with a heart rate of 75 recheck of blood pressure 160/70 with a heart rate of 73 Denies CP, SOB, palpitations, lower extremity edema, dizziness, headaches, or vision changes.  Blood pressure is not at goal and is well above established AHA goal of 140/90.Patient to stop taking olmesartan and start losartan hydrochlorothiazide.       -    Start losartan-hydrochlorothiazide (HYZAAR) 100-25 MG tablet; Take 1 tablet by mouth daily. -     CBC with Differential/Platelet -     CMP14+EGFR -     Lipid panel  2. Stage 3 chronic kidney disease, unspecified whether stage 3a or 3b CKD (HCC) Patient has history of low GFR.  Denies any problems or complications.  Patient is not taking any medications that impact kidney function.  Patient has not taken colchicine for any gout attacks in over a year.  Last creatinine was 1.03 with a BUN of 28 and GFR of 56 on  02/02/2023.  Will check kidney function. -     CMP14+EGFR  3. Hypercholesterolemia 4. Statin intolerance Reports ongoing dietary efforts to make healthier dietary choices. Denies formal exercise program however states that she stays active working in the garden and yard.  Cholesterol on 02/02/2023 was 249 with an LDL of 165. History of statin intolerance from 2022 and has been unable to tolerate even a weekly dose of Crestor or Zetia because of  causing leg pain.  Will check CMP and lipid panel to evaluate cholesterol and lipids. -     CMP14+EGFR -     Lipid panel  5. Itching Patient reports almost constant itching and will unconsciously excoriate her skin to the point of bleeding.  Patient has chronic lower extremity edema to ankles with small excoriated wounds from scratching.  Patient states she has tried almost every over-the-counter remedy for itching and only triamcinolone and [place] menthol improve but do not resolve itching.  Patient asking for refill of triamcinolone cream today. -     triamcinolone cream (KENALOG) 0.1 %; Apply topically 2 (two) times daily.  Continue all other maintenance medications.  Follow up plan: Return in about 2 weeks (around 03/22/2024) for nurse visit for BP check.  Patient to also update if medication changes have improved or resolved itching   Relevant past medical, surgical, family, and social history reviewed and updated as indicated.  Allergies and medications reviewed and updated. Data reviewed: Chart in  Epic.   Past Medical History:  Diagnosis Date   Allergy 1977   Face swelling   Arthritis    Breast cancer (HCC)    Bronchitis    Glaucoma Several years   Severe   History of right breast cancer 12/22/2016   History of tympanoplasty 03/09/2020   Hypercholesterolemia 10/10/2019   Hypertension    Neuropathy 10/11/2020   Skin cancer    Stage 3 chronic kidney disease (HCC) 10/10/2019    Past Surgical History:  Procedure Laterality Date    ABDOMINAL HYSTERECTOMY     APPENDECTOMY     BREAST SURGERY  April 2018   CESAREAN SECTION  07/1976   CESAREAN SECTION  03/1979   COSMETIC SURGERY  2017   EYE SURGERY  09-2016/01-2017   Glaucoma/Cataract surgery   FACIAL COSMETIC SURGERY     "eye lid surgery"   GLAUCOMA SURGERY     JOINT REPLACEMENT     KNEE ARTHROSCOPY Bilateral    torn meniscus on bilateral knees   MASTECTOMY Right    MASTECTOMY COMPLETE / SIMPLE W/ SENTINEL NODE BIOPSY Right 05/08/2017   MASTECTOMY W/ SENTINEL NODE BIOPSY Right 05/08/2017   Procedure: RIGHT MASTECTOMY WITH SENTINEL LYMPH NODE BIOPSY;  Surgeon: Griselda Miner, MD;  Location: [facility] OR;  Service: General;  Laterality: Right;   NOSE SURGERY     deviated septum    titanium ear implant Left    "for hearing" left, Dr. Dorma Russell   TUBAL LIGATION      Social History   Socioeconomic History   Marital status: Married    Spouse name: Giannah Zavadil   Number of children: 4   Years of education: 12   Highest education level: 12th grade  Occupational History   Occupation: Retired  Tobacco Use   Smoking status: Never   Smokeless tobacco: Never  Vaping Use   Vaping status: Never Used  Substance and Sexual Activity   Alcohol use: Yes    Alcohol/week: 1.0 standard drink of alcohol    Types: 1 Glasses of wine per week    Comment: Very little alcohol use   Drug use: No   Sexual activity: Not Currently    Partners: Male    Birth control/protection: None  Other Topics Concern   Not on file  Social History Narrative   Lives with husband. She has four children. She enjoys going to antique shopping and stays active.   Social Drivers of Corporate investment banker Strain: Low Risk  (03/04/2024)   Overall Financial Resource Strain (CARDIA)    Difficulty of Paying Living Expenses: Not hard at all  Food Insecurity: No Food Insecurity (03/04/2024)   Hunger Vital Sign    Worried About Running Out of Food in the Last Year: Never true    Ran Out of Food in the  Last Year: Never true  Transportation Needs: No Transportation Needs (03/04/2024)   PRAPARE - Administrator, Civil Service (Medical): No    Lack of Transportation (Non-Medical): No  Physical Activity: Inactive (03/04/2024)   Exercise Vital Sign    Days of Exercise per Week: 0 days    Minutes of Exercise per Session: 10 min  Stress: No Stress Concern Present (03/04/2024)   [employer] of Occupational Health - Occupational Stress Questionnaire    Feeling of Stress : Not at all  Social Connections: Moderately Integrated (03/04/2024)   Social Connection and Isolation Panel [NHANES]    Frequency of Communication with Friends and Family:  More than three times a week    Frequency of Social Gatherings with Friends and Family: Once a week    Attends Religious Services: More than 4 times per year    Active Member of [employer] or Organizations: No    Attends Banker Meetings: Never    Marital Status: Married  Catering manager Violence: Not At Risk (01/18/2024)   Humiliation, Afraid, Rape, and Kick questionnaire    Fear of Current or Ex-Partner: No    Emotionally Abused: No    Physically Abused: No    Sexually Abused: No    Outpatient Encounter Medications as of 03/08/2024  Medication Sig   colchicine 0.6 MG tablet Take 2 tablets (1.2mg ) now then take 1 tablet (0.6mg ) one hour later. Can take 1 tablet (0.6mg ) twice daily as needed until flare has resolved.   dorzolamide-timolol (COSOPT) 22.3-6.8 MG/ML ophthalmic solution Place 1 drop into both eyes 2 (two) times daily.   latanoprost (XALATAN) 0.005 % ophthalmic solution Place 1 drop into both eyes at bedtime.   losartan-hydrochlorothiazide (HYZAAR) 100-25 MG tablet Take 1 tablet by mouth daily.   [DISCONTINUED] hydrochlorothiazide (HYDRODIURIL) 25 MG tablet Take 1 tablet (25 mg total) by mouth daily. NEEDS APPOINTMENT FOR FURTHER REFILLS.   [DISCONTINUED] olmesartan (BENICAR) 40 MG tablet Take 1 tablet (40 mg total) by  mouth daily. NEEDS APPOINTMENT FOR FURTHER REFILLS.   [DISCONTINUED] triamcinolone cream (KENALOG) 0.1 % SMARTSIG:Sparingly Topical Twice Daily   triamcinolone cream (KENALOG) 0.1 % Apply topically 2 (two) times daily.   [DISCONTINUED] albuterol (PROVENTIL) (2.5 MG/3ML) 0.083% nebulizer solution Take 3 mLs (2.5 mg total) by nebulization every 6 (six) hours as needed for wheezing or shortness of breath.   [DISCONTINUED] azithromycin (ZITHROMAX) 250 MG tablet Take 1 tablet (250 mg total) by mouth daily. Take first 2 tablets together, then 1 every day until finished.   [DISCONTINUED] chlorpheniramine-HYDROcodone (TUSSIONEX) 10-8 MG/5ML Take 5 mLs by mouth at bedtime as needed for cough.   No facility-administered encounter medications on file as of 03/08/2024.    Allergies  Allergen Reactions   Meperidine Anaphylaxis   Gabapentin Other (See Comments)    Aphasia    Rosuvastatin Other (See Comments)    Myalgias   Tape     Adhesive tape::bruising   Tapentadol Other (See Comments)    Adhesive tape::bruising   Methylprednisolone Anxiety and Swelling    Review of Systems      Objective:  BP (!) 160/70 (BP Location: Left Arm, Cuff Size: Normal)   Pulse 73   Resp 20   Ht 5\' 8"  (1.727 m)   Wt 202 lb 6.4 oz (91.8 kg)   SpO2 100%   BMI 30.77 kg/m   Left   Physical Exam  Results for orders placed or performed during the hospital encounter of 12/19/23  POC SARS Coronavirus 2 Ag-ED -   Collection Time: 12/19/23  8:39 AM  Result Value Ref Range   SARS Coronavirus 2 Ag Negative Negative  POC Influenza A & B Ag (Urgent Care)   Collection Time: 12/19/23  8:55 AM  Result Value Ref Range   Influenza A Ag Negative    Influenza B Ag Negative        Pertinent labs & imaging results that were available during my care of the patient were reviewed by me and considered in my medical decision making.   Continue healthy lifestyle choices, including diet (rich in fruits, vegetables, and lean  proteins, and low in salt and simple carbohydrates)  and exercise (at least 30 minutes of moderate physical activity daily). Recheck of blood pressure was 160/70 with heart rate of 73.  The above assessment and management plan was discussed with the patient. The patient verbalized understanding of and has agreed to the management plan. Patient is aware to call the clinic if they develop any new symptoms or if symptoms persist or worsen. Patient is aware when to return to the clinic for a follow-up visit. Patient educated on when it is appropriate to go to the emergency department.   Maryelizabeth Kaufmann Student AGNP

## 2024-03-09 ENCOUNTER — Encounter: Payer: Self-pay | Admitting: Medical-Surgical

## 2024-03-09 LAB — CMP14+EGFR
ALT: 20 IU/L (ref 0–32)
AST: 20 IU/L (ref 0–40)
Albumin: 4.4 g/dL (ref 3.8–4.8)
Alkaline Phosphatase: 107 IU/L (ref 44–121)
BUN/Creatinine Ratio: 25 (ref 12–28)
BUN: 26 mg/dL (ref 8–27)
Bilirubin Total: 0.3 mg/dL (ref 0.0–1.2)
CO2: 23 mmol/L (ref 20–29)
Calcium: 10.6 mg/dL — ABNORMAL HIGH (ref 8.7–10.3)
Chloride: 101 mmol/L (ref 96–106)
Creatinine, Ser: 1.06 mg/dL — ABNORMAL HIGH (ref 0.57–1.00)
Globulin, Total: 2.4 g/dL (ref 1.5–4.5)
Glucose: 96 mg/dL (ref 70–99)
Potassium: 4.4 mmol/L (ref 3.5–5.2)
Sodium: 140 mmol/L (ref 134–144)
Total Protein: 6.8 g/dL (ref 6.0–8.5)
eGFR: 54 mL/min/{1.73_m2} — ABNORMAL LOW (ref >59)

## 2024-03-09 LAB — CBC WITH DIFFERENTIAL/PLATELET
Basophils Absolute: 0.1 10*3/uL (ref 0.0–0.2)
Basos: 1 %
EOS (ABSOLUTE): 0.3 10*3/uL (ref 0.0–0.4)
Eos: 6 %
Hematocrit: 36.8 % (ref 34.0–46.6)
Hemoglobin: 12 g/dL (ref 11.1–15.9)
Immature Grans (Abs): 0 10*3/uL (ref 0.0–0.1)
Immature Granulocytes: 0 %
Lymphocytes Absolute: 1.2 10*3/uL (ref 0.7–3.1)
Lymphs: 25 %
MCH: 29.3 pg (ref 26.6–33.0)
MCHC: 32.6 g/dL (ref 31.5–35.7)
MCV: 90 fL (ref 79–97)
Monocytes Absolute: 0.4 10*3/uL (ref 0.1–0.9)
Monocytes: 9 %
Neutrophils Absolute: 2.9 10*3/uL (ref 1.4–7.0)
Neutrophils: 59 %
Platelets: 263 10*3/uL (ref 150–450)
RBC: 4.1 x10E6/uL (ref 3.77–5.28)
RDW: 12.8 % (ref 11.7–15.4)
WBC: 4.9 10*3/uL (ref 3.4–10.8)

## 2024-03-09 LAB — LIPID PANEL
Chol/HDL Ratio: 4.7 ratio — ABNORMAL HIGH (ref 0.0–4.4)
Cholesterol, Total: 239 mg/dL — ABNORMAL HIGH (ref 100–199)
HDL: 51 mg/dL (ref >39)
LDL Chol Calc (NIH): 167 mg/dL — ABNORMAL HIGH (ref 0–99)
Triglycerides: 116 mg/dL (ref 0–149)
VLDL Cholesterol Cal: 21 mg/dL (ref 5–40)

## 2024-03-09 LAB — URIC ACID: Uric Acid: 8.8 mg/dL — ABNORMAL HIGH (ref 3.1–7.9)

## 2024-03-09 NOTE — Progress Notes (Signed)
 Medical screening examination/treatment was performed by qualified clinical staff member and as supervising provider I was immediately available for consultation/collaboration. I have reviewed documentation and agree with assessment and plan.  Thayer Ohm, DNP, APRN, FNP-BC [facility] and Sports Medicine

## 2024-03-11 DIAGNOSIS — D045 Carcinoma in situ of skin of trunk: Secondary | ICD-10-CM | POA: Diagnosis not present

## 2024-03-22 ENCOUNTER — Ambulatory Visit

## 2024-03-23 ENCOUNTER — Ambulatory Visit (INDEPENDENT_AMBULATORY_CARE_PROVIDER_SITE_OTHER)

## 2024-03-23 VITALS — BP 131/56 | HR 54 | Ht 68.0 in | Wt 201.8 lb

## 2024-03-23 DIAGNOSIS — I10 Essential (primary) hypertension: Secondary | ICD-10-CM | POA: Diagnosis not present

## 2024-03-23 NOTE — Progress Notes (Signed)
 Patient is here for blood pressure check. Denies trouble sleeping, palpitations, dizziness, lightheadedness, blurry vision, chest pain, shortness of breath, headaches and/or medication problems.   Patient's blood pressure was out of goal range 150/64, pulse 61. Patient sat for 15 minutes. Blood pressure recheck was not at goal. Provider notified of current blood pressure reading. Per provider, patient is to continue with Losartan/hydrochlorothiazide as directed.   Patient informed to schedule next appointment in 3 months with provider.

## 2024-03-24 DIAGNOSIS — H906 Mixed conductive and sensorineural hearing loss, bilateral: Secondary | ICD-10-CM | POA: Diagnosis not present

## 2024-03-24 DIAGNOSIS — Z9889 Other specified postprocedural states: Secondary | ICD-10-CM | POA: Diagnosis not present

## 2024-03-24 DIAGNOSIS — Z0110 Encounter for examination of ears and hearing without abnormal findings: Secondary | ICD-10-CM | POA: Diagnosis not present

## 2024-03-24 DIAGNOSIS — H90A22 Sensorineural hearing loss, unilateral, left ear, with restricted hearing on the contralateral side: Secondary | ICD-10-CM | POA: Diagnosis not present

## 2024-04-08 DIAGNOSIS — Z86018 Personal history of other benign neoplasm: Secondary | ICD-10-CM | POA: Diagnosis not present

## 2024-04-08 DIAGNOSIS — D2272 Melanocytic nevi of left lower limb, including hip: Secondary | ICD-10-CM | POA: Diagnosis not present

## 2024-04-08 DIAGNOSIS — Z85828 Personal history of other malignant neoplasm of skin: Secondary | ICD-10-CM | POA: Diagnosis not present

## 2024-04-08 DIAGNOSIS — L300 Nummular dermatitis: Secondary | ICD-10-CM | POA: Diagnosis not present

## 2024-04-08 DIAGNOSIS — D485 Neoplasm of uncertain behavior of skin: Secondary | ICD-10-CM | POA: Diagnosis not present

## 2024-04-08 DIAGNOSIS — L719 Rosacea, unspecified: Secondary | ICD-10-CM | POA: Diagnosis not present

## 2024-04-08 DIAGNOSIS — D0470 Carcinoma in situ of skin of unspecified lower limb, including hip: Secondary | ICD-10-CM | POA: Diagnosis not present

## 2024-04-08 DIAGNOSIS — D225 Melanocytic nevi of trunk: Secondary | ICD-10-CM | POA: Diagnosis not present

## 2024-04-08 DIAGNOSIS — L570 Actinic keratosis: Secondary | ICD-10-CM | POA: Diagnosis not present

## 2024-04-08 DIAGNOSIS — L820 Inflamed seborrheic keratosis: Secondary | ICD-10-CM | POA: Diagnosis not present

## 2024-04-08 DIAGNOSIS — L578 Other skin changes due to chronic exposure to nonionizing radiation: Secondary | ICD-10-CM | POA: Diagnosis not present

## 2024-04-08 DIAGNOSIS — L821 Other seborrheic keratosis: Secondary | ICD-10-CM | POA: Diagnosis not present

## 2024-04-08 DIAGNOSIS — Z86006 Personal history of melanoma in-situ: Secondary | ICD-10-CM | POA: Diagnosis not present

## 2024-06-01 DIAGNOSIS — D0471 Carcinoma in situ of skin of right lower limb, including hip: Secondary | ICD-10-CM | POA: Diagnosis not present

## 2024-06-06 ENCOUNTER — Other Ambulatory Visit: Payer: Self-pay | Admitting: Medical-Surgical

## 2024-06-06 DIAGNOSIS — I10 Essential (primary) hypertension: Secondary | ICD-10-CM

## 2024-06-06 NOTE — Telephone Encounter (Signed)
 Copied from CRM [id number]. Topic: Clinical - Medication Refill >> Jun 06, 2024  3:25 PM Tisa Forester wrote: Medication: Hydrochlorothiazide  25 mg   Has the patient contacted their pharmacy? Yes  (Agent: If no, request that the patient contact the pharmacy for the refill. If patient does not wish to contact the pharmacy document the reason why and proceed with request.) (Agent: If yes, when and what did the pharmacy advise?)  This is the patient's preferred pharmacy:  [id number] - [place], [place] - [address] [place] [address] Phone: [phone] Fax: [phone]  Is this the correct pharmacy for this prescription? Yes If no, delete pharmacy and type the correct one.   Has the prescription been filled recently? No  Is the patient out of the medication? Yes  Has the patient been seen for an appointment in the last year OR does the patient have an upcoming appointment? Yes  Can we respond through MyChart? No  Agent: Please be advised that Rx refills may take up to 3 business days. We ask that you follow-up with your pharmacy.

## 2024-06-07 ENCOUNTER — Telehealth: Payer: Self-pay

## 2024-06-07 NOTE — Telephone Encounter (Signed)
 Copied from CRM [id number]. Topic: Clinical - Prescription Issue >> Jun 06, 2024  3:21 PM Tisa Forester wrote: Reason for CRM: Joy change her blood pressure medication to   Olmesartan  Medoxomil (40 mg Patient request if can get the  Olmesartan  Medoxomil (40 mgneed for 30 days reason patient appointment is not until 07/05/24 and patient would out of the medication by then  And patient would need an appointment before further refills , issue patient appt is 07/05/24 and patient would be out of the medication  Call back [phone]

## 2024-06-07 NOTE — Telephone Encounter (Signed)
 Forwarding message to Dr. Augustus Ledger covering Amy [name] Requesting rx rf of Olmesartan  40mg   Not showing in current med list but note in chart dated 03/09/2024 reads as below: Amy Cornish, NP to MACKINLEY CASSADAY     04/05/24  7:16 AM Hi Felipa Horsfall, I think it would be reasonable to switch back to the olmesartan .  Hopefully we will find out what is going on with the itching after you see dermatology.  Please keep me updated. Thanks, Kenney Peacemaker But also note at  clinical support visit dated 03/23/2024 reads to continue Losartan  / hctz

## 2024-06-08 MED ORDER — OLMESARTAN MEDOXOMIL 40 MG PO TABS: 40.0000 mg | ORAL_TABLET | Freq: Every day | ORAL | 1 refills | Status: DC

## 2024-06-08 NOTE — Addendum Note (Signed)
 Addended by: Trinnity Breunig E on: 06/08/2024 05:02 PM   Modules accepted: Orders

## 2024-06-10 MED ORDER — HYDROCHLOROTHIAZIDE 25 MG PO TABS: 25.0000 mg | ORAL_TABLET | Freq: Every day | ORAL | 0 refills | Status: DC

## 2024-06-10 NOTE — Addendum Note (Signed)
 Addended by: Abundio Hoit on: 06/10/2024 10:11 AM   Modules accepted: Orders

## 2024-06-20 DIAGNOSIS — Z1231 Encounter for screening mammogram for malignant neoplasm of breast: Secondary | ICD-10-CM | POA: Diagnosis not present

## 2024-06-20 DIAGNOSIS — Z01419 Encounter for gynecological examination (general) (routine) without abnormal findings: Secondary | ICD-10-CM | POA: Diagnosis not present

## 2024-06-20 LAB — HM MAMMOGRAPHY

## 2024-06-28 ENCOUNTER — Ambulatory Visit: Admitting: Medical-Surgical

## 2024-07-04 DIAGNOSIS — Z961 Presence of intraocular lens: Secondary | ICD-10-CM | POA: Diagnosis not present

## 2024-07-04 DIAGNOSIS — H401113 Primary open-angle glaucoma, right eye, severe stage: Secondary | ICD-10-CM | POA: Diagnosis not present

## 2024-07-04 DIAGNOSIS — H0102B Squamous blepharitis left eye, upper and lower eyelids: Secondary | ICD-10-CM | POA: Diagnosis not present

## 2024-07-04 DIAGNOSIS — H26492 Other secondary cataract, left eye: Secondary | ICD-10-CM | POA: Diagnosis not present

## 2024-07-04 DIAGNOSIS — H401122 Primary open-angle glaucoma, left eye, moderate stage: Secondary | ICD-10-CM | POA: Diagnosis not present

## 2024-07-04 DIAGNOSIS — H0102A Squamous blepharitis right eye, upper and lower eyelids: Secondary | ICD-10-CM | POA: Diagnosis not present

## 2024-07-05 ENCOUNTER — Ambulatory Visit (INDEPENDENT_AMBULATORY_CARE_PROVIDER_SITE_OTHER): Admitting: Medical-Surgical

## 2024-07-05 ENCOUNTER — Encounter: Payer: Self-pay | Admitting: Medical-Surgical

## 2024-07-05 VITALS — BP 166/71 | HR 59 | Resp 20 | Ht 68.0 in | Wt 193.0 lb

## 2024-07-05 DIAGNOSIS — M1A30X0 Chronic gout due to renal impairment, unspecified site, without tophus (tophi): Secondary | ICD-10-CM

## 2024-07-05 DIAGNOSIS — I10 Essential (primary) hypertension: Secondary | ICD-10-CM | POA: Diagnosis not present

## 2024-07-05 DIAGNOSIS — C4442 Squamous cell carcinoma of skin of scalp and neck: Secondary | ICD-10-CM | POA: Insufficient documentation

## 2024-07-05 DIAGNOSIS — L821 Other seborrheic keratosis: Secondary | ICD-10-CM | POA: Insufficient documentation

## 2024-07-05 DIAGNOSIS — Z86006 Personal history of melanoma in-situ: Secondary | ICD-10-CM | POA: Insufficient documentation

## 2024-07-05 MED ORDER — OLMESARTAN MEDOXOMIL 40 MG PO TABS: 40.0000 mg | ORAL_TABLET | Freq: Every day | ORAL | 1 refills | Status: DC

## 2024-07-05 MED ORDER — HYDROCHLOROTHIAZIDE 25 MG PO TABS: 25.0000 mg | ORAL_TABLET | Freq: Every day | ORAL | 1 refills | Status: DC

## 2024-07-05 MED ORDER — COLCHICINE 0.6 MG PO TABS: ORAL_TABLET | ORAL | 1 refills | Status: AC

## 2024-07-05 NOTE — Progress Notes (Signed)
 Established patient visit  Discussed the use of AI scribe software for clinical note transcription with the patient, who gave verbal consent to proceed.  History of Present Illness   The patient is a 78 year old with hypertension who presents for blood pressure management and medication refills.  Hypertension - Blood pressure elevated upon clinic arrival but remains well-controlled at home - Monitors blood pressure when feeling unwell but not regularly - No chest pain, shortness of breath, palpitations, or dizziness - Current antihypertensive medications: olmesartan  40 mg and hydrochlorothiazide  25 mg - Needs refill of hydrochlorothiazide   Peripheral neuropathy and lower extremity discomfort - Numbness in the feet and leg discomfort affecting mobility - Uses store carts for support and a mini walker for longer shopping trips - Remains active by breaking tasks into smaller parts and maintaining household chores - Weight decreased from 202 pounds in April to 193 pounds, with a goal of 180 pounds to alleviate leg discomfort  Gout flares - Gout flares occurred several months ago - Managed with colchicine  as needed  Vertigo - Vertigo occurs when lying flat, resolves with position change - No vertigo while upright or on her side  Physical activity - Engages in chair aerobics daily for 20 minutes      Physical Exam Vitals reviewed.  Constitutional:      General: She is not in acute distress.    Appearance: Normal appearance. She is not ill-appearing.  HENT:     Head: Normocephalic and atraumatic.  Cardiovascular:     Rate and Rhythm: Normal rate and regular rhythm.     Pulses: Normal pulses.     Heart sounds: Normal heart sounds. No murmur heard.    No friction rub. No gallop.  Pulmonary:     Effort: Pulmonary effort is normal. No respiratory distress.     Breath sounds: Normal breath sounds. No wheezing.  Musculoskeletal:     Right lower leg: Edema present.      Left lower leg: Edema present.  Skin:    General: Skin is warm and dry.  Neurological:     Mental Status: She is alert and oriented to person, place, and time.  Psychiatric:        Mood and Affect: Mood normal.        Behavior: Behavior normal.        Thought Content: Thought content normal.        Judgment: Judgment normal.     Assessment and Plan    Hypertension Blood pressure elevated at 153/72 mmHg. Prefers separate medications. Declines additional medication, opting for lifestyle modifications. - Recheck blood pressure during visit, remains elevated. - Refill olmesartan  and hydrochlorothiazide . - Encourage low sodium diet and weight loss. - Advise home blood pressure monitoring and report significant changes.  Gout No recent flares. Uric acid level 8.8 in March. Prefers lifestyle modifications over allopurinol. - Prescribe colchicine  for use as needed. Use very sparingly. - Hold allopurinol unless another flare occurs.  Vertigo Positional vertigo when lying flat, resolves with position change. - Advise avoiding lying flat. - Consider Meclizine prn.  General Health Maintenance Engages in chair aerobics, weight loss through dietary changes. Recent normal mammogram. Considering discontinuing Pap smears due to age and history. - Encourage chair aerobics. - Support weight loss and dietary changes. - Acknowledge decision to discontinue Pap smears.      Return in about 4 months (around 11/05/2024) for HTN follow up.  __________________________________ Zada FREDRIK Palin, DNP,  APRN, FNP-BC Primary Care and Sports Medicine [facility]

## 2024-07-08 DIAGNOSIS — Z86018 Personal history of other benign neoplasm: Secondary | ICD-10-CM | POA: Diagnosis not present

## 2024-07-08 DIAGNOSIS — Z86006 Personal history of melanoma in-situ: Secondary | ICD-10-CM | POA: Diagnosis not present

## 2024-07-08 DIAGNOSIS — Z85828 Personal history of other malignant neoplasm of skin: Secondary | ICD-10-CM | POA: Diagnosis not present

## 2024-07-08 DIAGNOSIS — D485 Neoplasm of uncertain behavior of skin: Secondary | ICD-10-CM | POA: Diagnosis not present

## 2024-07-08 DIAGNOSIS — D225 Melanocytic nevi of trunk: Secondary | ICD-10-CM | POA: Diagnosis not present

## 2024-07-08 DIAGNOSIS — L820 Inflamed seborrheic keratosis: Secondary | ICD-10-CM | POA: Diagnosis not present

## 2024-07-08 DIAGNOSIS — L821 Other seborrheic keratosis: Secondary | ICD-10-CM | POA: Diagnosis not present

## 2024-07-08 DIAGNOSIS — L570 Actinic keratosis: Secondary | ICD-10-CM | POA: Diagnosis not present

## 2024-07-08 DIAGNOSIS — D2272 Melanocytic nevi of left lower limb, including hip: Secondary | ICD-10-CM | POA: Diagnosis not present

## 2024-07-08 DIAGNOSIS — L719 Rosacea, unspecified: Secondary | ICD-10-CM | POA: Diagnosis not present

## 2024-07-08 DIAGNOSIS — L578 Other skin changes due to chronic exposure to nonionizing radiation: Secondary | ICD-10-CM | POA: Diagnosis not present

## 2024-08-09 DIAGNOSIS — D0511 Intraductal carcinoma in situ of right breast: Secondary | ICD-10-CM | POA: Diagnosis not present

## 2024-10-05 DIAGNOSIS — L309 Dermatitis, unspecified: Secondary | ICD-10-CM | POA: Diagnosis not present

## 2024-10-05 DIAGNOSIS — D485 Neoplasm of uncertain behavior of skin: Secondary | ICD-10-CM | POA: Diagnosis not present

## 2024-10-05 DIAGNOSIS — L821 Other seborrheic keratosis: Secondary | ICD-10-CM | POA: Diagnosis not present

## 2024-10-05 DIAGNOSIS — Z85828 Personal history of other malignant neoplasm of skin: Secondary | ICD-10-CM | POA: Diagnosis not present

## 2024-10-05 DIAGNOSIS — D2272 Melanocytic nevi of left lower limb, including hip: Secondary | ICD-10-CM | POA: Diagnosis not present

## 2024-10-05 DIAGNOSIS — Z86006 Personal history of melanoma in-situ: Secondary | ICD-10-CM | POA: Diagnosis not present

## 2024-10-05 DIAGNOSIS — D225 Melanocytic nevi of trunk: Secondary | ICD-10-CM | POA: Diagnosis not present

## 2024-10-05 DIAGNOSIS — Z86018 Personal history of other benign neoplasm: Secondary | ICD-10-CM | POA: Diagnosis not present

## 2024-10-05 DIAGNOSIS — L719 Rosacea, unspecified: Secondary | ICD-10-CM | POA: Diagnosis not present

## 2024-10-05 DIAGNOSIS — L570 Actinic keratosis: Secondary | ICD-10-CM | POA: Diagnosis not present

## 2024-10-05 DIAGNOSIS — L578 Other skin changes due to chronic exposure to nonionizing radiation: Secondary | ICD-10-CM | POA: Diagnosis not present

## 2024-10-19 DIAGNOSIS — E7800 Pure hypercholesterolemia, unspecified: Secondary | ICD-10-CM | POA: Diagnosis not present

## 2024-10-19 DIAGNOSIS — Z0000 Encounter for general adult medical examination without abnormal findings: Secondary | ICD-10-CM | POA: Diagnosis not present

## 2024-10-19 DIAGNOSIS — I10 Essential (primary) hypertension: Secondary | ICD-10-CM | POA: Diagnosis not present

## 2024-10-19 DIAGNOSIS — M109 Gout, unspecified: Secondary | ICD-10-CM | POA: Diagnosis not present

## 2024-10-19 DIAGNOSIS — N1831 Chronic kidney disease, stage 3a: Secondary | ICD-10-CM | POA: Diagnosis not present

## 2024-10-31 DIAGNOSIS — C44729 Squamous cell carcinoma of skin of left lower limb, including hip: Secondary | ICD-10-CM | POA: Diagnosis not present

## 2024-11-02 DIAGNOSIS — H0102B Squamous blepharitis left eye, upper and lower eyelids: Secondary | ICD-10-CM | POA: Diagnosis not present

## 2024-11-02 DIAGNOSIS — H401122 Primary open-angle glaucoma, left eye, moderate stage: Secondary | ICD-10-CM | POA: Diagnosis not present

## 2024-11-02 DIAGNOSIS — H0102A Squamous blepharitis right eye, upper and lower eyelids: Secondary | ICD-10-CM | POA: Diagnosis not present

## 2024-11-02 DIAGNOSIS — H401113 Primary open-angle glaucoma, right eye, severe stage: Secondary | ICD-10-CM | POA: Diagnosis not present

## 2024-11-02 DIAGNOSIS — Z961 Presence of intraocular lens: Secondary | ICD-10-CM | POA: Diagnosis not present

## 2024-11-04 ENCOUNTER — Ambulatory Visit: Admitting: Medical-Surgical

## 2024-11-14 DIAGNOSIS — C44722 Squamous cell carcinoma of skin of right lower limb, including hip: Secondary | ICD-10-CM | POA: Diagnosis not present

## 2024-11-15 ENCOUNTER — Encounter: Payer: Self-pay | Admitting: Medical-Surgical

## 2024-11-15 ENCOUNTER — Ambulatory Visit: Admitting: Medical-Surgical

## 2024-11-15 VITALS — BP 169/70 | HR 64 | Resp 20 | Ht 68.0 in | Wt 192.0 lb

## 2024-11-15 DIAGNOSIS — I10 Essential (primary) hypertension: Secondary | ICD-10-CM

## 2024-11-15 DIAGNOSIS — E7800 Pure hypercholesterolemia, unspecified: Secondary | ICD-10-CM | POA: Diagnosis not present

## 2024-11-15 DIAGNOSIS — M1A30X0 Chronic gout due to renal impairment, unspecified site, without tophus (tophi): Secondary | ICD-10-CM | POA: Diagnosis not present

## 2024-11-15 MED ORDER — HYDROCHLOROTHIAZIDE 25 MG PO TABS: 25.0000 mg | ORAL_TABLET | Freq: Every day | ORAL | 1 refills | Status: AC

## 2024-11-15 MED ORDER — OLMESARTAN MEDOXOMIL 40 MG PO TABS: 40.0000 mg | ORAL_TABLET | Freq: Every day | ORAL | 1 refills | Status: AC

## 2024-11-15 MED ORDER — AMLODIPINE BESYLATE 5 MG PO TABS: 5.0000 mg | ORAL_TABLET | Freq: Every day | ORAL | 3 refills | Status: AC

## 2024-11-15 NOTE — Progress Notes (Signed)
 Medical screening examination/treatment was performed by qualified clinical staff member and as supervising provider I was immediately available for consultation/collaboration. I have reviewed documentation and agree with assessment and plan.  Thayer Ohm, DNP, APRN, FNP-BC [facility] and Sports Medicine

## 2024-11-15 NOTE — Progress Notes (Signed)
 Established Patient Office Visit  Subjective   Patient ID: Amy [name], female    DOB: [birth year]  Age: 78 y.o. MRN: [id number]  Chief Complaint  Patient presents with   Hypertension    HPI  78 year old female presents for 6 month follow up on the following chronic diseases  Essential Hypertension Patient is currently taking Benicar  40 mg daily and hydrochlorothiazide  25 mg daily. She does not routinely check her blood pressures at home. BP continues to be elevated in the office today with visually moderate peripheral edema. Denies having any chest pain, shortness of breath, heart palpitations, or dizziness.  Hypercholesterolemia  Patient is not currently taking medication to control her cholesterol. States managing cholesterol with dietary changes. Reports having an intolerance to statins.  Gout Patient is currently taking colchicine  0.6 mg as needed for gout. Reports doing well and denies any recent flare ups.    Review of Systems  Constitutional: Negative.   HENT: Negative.    Eyes: Negative.   Respiratory: Negative.    Cardiovascular: Negative.   Gastrointestinal: Negative.   Genitourinary: Negative.   Musculoskeletal: Negative.   Skin: Negative.   Neurological: Negative.   Endo/Heme/Allergies: Negative.   Psychiatric/Behavioral: Negative.        Objective:     BP (!) 175/70 (BP Location: Left Arm, Cuff Size: Large)   Pulse 67   Resp 20   Ht 5' 8 (1.727 m)   Wt 87.1 kg   SpO2 94%   BMI 29.19 kg/m  BP Readings from Last 3 Encounters:  11/15/24 (!) 175/70  07/05/24 (!) 166/71  03/23/24 (!) 131/56   Wt Readings from Last 3 Encounters:  11/15/24 87.1 kg  07/05/24 87.5 kg  03/23/24 91.5 kg      Physical Exam Vitals and nursing note reviewed.  Constitutional:      General: She is not in acute distress.    Appearance: Normal appearance.  Cardiovascular:     Rate and Rhythm: Normal rate and regular rhythm.     Pulses: Normal pulses.      Heart sounds: Normal heart sounds.  Pulmonary:     Effort: Pulmonary effort is normal.     Breath sounds: Normal breath sounds.  Musculoskeletal:     Right lower leg: 3+ Edema present.     Left lower leg: 3+ Edema present.  Neurological:     General: No focal deficit present.     Mental Status: She is alert and oriented to person, place, and time.  Psychiatric:        Mood and Affect: Mood normal.        Behavior: Behavior normal.        Thought Content: Thought content normal.        Judgment: Judgment normal.      No results found for any visits on 11/15/24.  Last metabolic panel Lab Results  Component Value Date   GLUCOSE 96 03/08/2024   NA 140 03/08/2024   K 4.4 03/08/2024   CL 101 03/08/2024   CO2 23 03/08/2024   BUN 26 03/08/2024   CREATININE 1.06 (H) 03/08/2024   EGFR 54 (L) 03/08/2024   CALCIUM 10.6 (H) 03/08/2024   PROT 6.8 03/08/2024   ALBUMIN 4.4 03/08/2024   LABGLOB 2.4 03/08/2024   BILITOT 0.3 03/08/2024   ALKPHOS 107 03/08/2024   AST 20 03/08/2024   ALT 20 03/08/2024   ANIONGAP 11 05/15/2021   Last lipids Lab Results  Component Value Date  CHOL 239 (H) 03/08/2024   HDL 51 03/08/2024   LDLCALC 167 (H) 03/08/2024   TRIG 116 03/08/2024   CHOLHDL 4.7 (H) 03/08/2024      The 10-year ASCVD risk score (Arnett DK, et al., 2019) is: 46%    Assessment & Plan:   1. Essential hypertension (Primary) -Continue with current treatment plan taking Benicar  40 mg daily and hydrochlorothiazide  25 mg daily - Will add amlodipine  5 mg daily to current regimen - Initial BP 175/70 and repeat 169/70 -Follow up in 2 weeks for Nurse visit BP check - hydrochlorothiazide  (HYDRODIURIL ) 25 MG tablet; Take 1 tablet (25 mg total) by mouth daily.  Dispense: 90 tablet; Refill: 1 - olmesartan  (BENICAR ) 40 MG tablet; Take 1 tablet (40 mg total) by mouth daily.  Dispense: 90 tablet; Refill: 1  2. Hypercholesterolemia - Continue with dietary management - Intolerance to  statins  3. Chronic gout due to renal impairment without tophus, unspecified site -Continue with current treatment regimen taking colchicine  0.6 mg as needed for gout flare up   Return in about 2 weeks (around 11/29/2024) for nurse visit for BP check.    Derrek JINNY Freund, NP Student

## 2024-11-28 DIAGNOSIS — C44729 Squamous cell carcinoma of skin of left lower limb, including hip: Secondary | ICD-10-CM | POA: Diagnosis not present

## 2024-11-30 ENCOUNTER — Ambulatory Visit (INDEPENDENT_AMBULATORY_CARE_PROVIDER_SITE_OTHER)

## 2024-11-30 VITALS — BP 140/61 | HR 69 | Resp 20 | Ht 68.0 in | Wt 192.0 lb

## 2024-11-30 DIAGNOSIS — I10 Essential (primary) hypertension: Secondary | ICD-10-CM | POA: Diagnosis not present

## 2024-11-30 NOTE — Progress Notes (Signed)
° °  Subjective:    Patient ID: Amy [name], female    DOB: 02-28-1946, 79 y.o.   MRN: [id number]  HPI  Patient is here for a 2 week BP recheck. Patient currently takes amlodipine  5 mg which was added in his last OV, olmesartan  40 mg, and hydrochlorothiazide  25 mg. Patients she has not taken her medications yet. She normally takes them all around 10 am Denies CP, SOB, heart palpitations, medication changes, or vision changes.  Review of Systems     Objective:   Physical Exam        Assessment & Plan:   Patients first Bp 144/66 is second is 140/61 reported to PCP joy who would like to pt to cont. Her same regimen and follow up with her in 3 months.

## 2024-12-08 ENCOUNTER — Ambulatory Visit
Admission: RE | Admit: 2024-12-08 | Discharge: 2024-12-08 | Disposition: A | Discharge status: Home / Self Care | Attending: Family Medicine | Admitting: Family Medicine

## 2024-12-08 VITALS — BP 164/87 | HR 92 | Temp 100.9°F | Resp 18

## 2024-12-08 DIAGNOSIS — J111 Influenza due to unidentified influenza virus with other respiratory manifestations: Secondary | ICD-10-CM | POA: Diagnosis not present

## 2024-12-08 LAB — POCT INFLUENZA A/B
Influenza A, POC: NEGATIVE
Influenza B, POC: POSITIVE — AB

## 2024-12-08 MED ORDER — OSELTAMIVIR PHOSPHATE 75 MG PO CAPS: 75.0000 mg | ORAL_CAPSULE | Freq: Two times a day (BID) | ORAL | 0 refills | Status: DC

## 2024-12-08 MED ORDER — ACETAMINOPHEN 325 MG PO TABS
650.0000 mg | ORAL_TABLET | Freq: Once | ORAL | Status: AC
  Administered 2024-12-08: 12:00:00 650 mg via ORAL

## 2024-12-08 MED ORDER — HYDROCOD POLI-CHLORPHE POLI ER 10-8 MG/5ML PO SUER: 5.0000 mL | Freq: Two times a day (BID) | ORAL | 0 refills | Status: DC | PRN

## 2024-12-08 NOTE — ED Provider Notes (Signed)
 Amy [name]    CSN: [id number] Arrival date & time: 12/08/24  1116      History   Chief Complaint Chief Complaint  Patient presents with   Cough    Bronchitis - Entered by patient    HPI Amy [name] is a 78 y.o. female.   Patient is here for cough.  It started yesterday.  She also has fever chills and fatigue.  She thinks she is coming down with bronchitis.  She states the cough kept her awake all night.  No known exposure to illness.  Never gets flu shots.    Past Medical History:  Diagnosis Date   Allergy 1977   Face swelling   Arthritis Not sure   Breast cancer (HCC)    Bronchitis    Glaucoma Several years   Severe   History of melanoma in situ 07/05/2024   History of right breast cancer 12/22/2016   History of tympanoplasty 03/09/2020   Hypercholesterolemia 10/10/2019   Hypertension App. 20 years   Neuropathy 10/11/2020   Skin cancer    Stage 3 chronic kidney disease (HCC) 10/10/2019    Patient Active Problem List   Diagnosis Date Noted   Squamous cell carcinoma of skin of neck 07/05/2024   Seborrheic keratoses 07/05/2024   Squamous cell cancer of skin of right forearm 08/20/2023   Statin intolerance 10/15/2022   Melanocytic nevi of trunk 07/31/2022   Benign hypertensive renal disease 06/19/2021   Gout 10/11/2020   Neuropathy 10/11/2020   Glaucoma 08/09/2020   Mixed conductive and sensorineural hearing loss of both ears 03/09/2020   Sensorineural hearing loss (SNHL) of left ear with restricted hearing of right ear 03/09/2020   Essential hypertension 10/10/2019   Hypercholesterolemia 10/10/2019   Stage 3 chronic kidney disease (HCC) 10/10/2019   Body mass index (BMI) of 25.0 to 29.9 04/25/2019   Ductal carcinoma in situ (DCIS) of right breast 05/08/2017    Past Surgical History:  Procedure Laterality Date   ABDOMINAL HYSTERECTOMY  1994   APPENDECTOMY  August 1977   BREAST SURGERY  April 2018   CESAREAN SECTION  1977-1980    CESAREAN SECTION  03/1979   COSMETIC SURGERY  2017   EYE SURGERY  09-2016/01-2017   Glaucoma/Cataract surgery   FACIAL COSMETIC SURGERY     eye lid surgery   GLAUCOMA SURGERY     JOINT REPLACEMENT  05-2020   Full knee replacement   KNEE ARTHROSCOPY Bilateral    torn meniscus on bilateral knees   MASTECTOMY Right    MASTECTOMY COMPLETE / SIMPLE W/ SENTINEL NODE BIOPSY Right 05/08/2017   MASTECTOMY W/ SENTINEL NODE BIOPSY Right 05/08/2017   Procedure: RIGHT MASTECTOMY WITH SENTINEL LYMPH NODE BIOPSY;  Surgeon: Curvin Deward MOULD, MD;  Location: [facility] OR;  Service: General;  Laterality: Right;   NOSE SURGERY     deviated septum    titanium ear implant Left    for hearing left, Dr. Thaddeus   TUBAL LIGATION  [phone]    OB History   No obstetric history on file.      Home Medications    Prior to Admission medications  Medication Sig Start Date End Date Taking? Authorizing Provider  chlorpheniramine-HYDROcodone  (TUSSIONEX) 10-8 MG/5ML Take 5 mLs by mouth every 12 (twelve) hours as needed for cough. 12/08/24  Yes Maranda Jamee Jacob, MD  oseltamivir  (TAMIFLU ) 75 MG capsule Take 1 capsule (75 mg total) by mouth every 12 (twelve) hours. 12/08/24  Yes Maranda Jamee Jacob, MD  amLODipine  (NORVASC ) 5 MG tablet Take 1 tablet (5 mg total) by mouth daily. 11/15/24   Willo Mini, NP  colchicine  0.6 MG tablet Take 2 tablets (1.2mg ) now then take 1 tablet (0.6mg ) one hour later. Can take 1 tablet (0.6mg ) twice daily as needed until flare has resolved. 07/05/24   Willo Mini, NP  dorzolamide-timolol  (COSOPT) 22.3-6.8 MG/ML ophthalmic solution Place 1 drop into both eyes 2 (two) times daily.    [provider]  hydrochlorothiazide  (HYDRODIURIL ) 25 MG tablet Take 1 tablet (25 mg total) by mouth daily. 11/15/24   Willo Mini, NP  latanoprost  (XALATAN ) 0.005 % ophthalmic solution Place 1 drop into both eyes at bedtime.    [provider]  olmesartan  (BENICAR ) 40 MG tablet Take 1 tablet (40 mg  total) by mouth daily. 11/15/24   Willo Mini, NP  triamcinolone  cream (KENALOG ) 0.1 % Apply topically 2 (two) times daily. 03/08/24   Willo Mini, NP    Family History Family History  Problem Relation Age of Onset   Hypertension Mother    Skin cancer Father    Prostate cancer Father    Cancer Father 62   Breast cancer Sister    Cancer Sister 47    Social History Social History[1]   Allergies   Meperidine, Gabapentin , Rosuvastatin, Tape, Tapentadol, and Methylprednisolone   Review of Systems Review of Systems See HPI  Physical Exam Triage Vital Signs ED Triage Vitals  Encounter Vitals Group     BP 12/08/24 1129 (!) 187/81     Girls Systolic BP Percentile --      Girls Diastolic BP Percentile --      Boys Systolic BP Percentile --      Boys Diastolic BP Percentile --      Pulse Rate 12/08/24 1129 92     Resp 12/08/24 1129 18     Temp 12/08/24 1129 (!) 100.9 F (38.3 C)     Temp Source 12/08/24 1129 Oral     SpO2 12/08/24 1129 97 %     Weight --      Height --      Head Circumference --      Peak Flow --      Pain Score 12/08/24 1130 0     Pain Loc --      Pain Education --      Exclude from Growth Chart --    No data found.  Updated Vital Signs BP (!) 164/87 (BP Location: Left Arm)   Pulse 92   Temp (!) 100.9 F (38.3 C) (Oral)   Resp 18   SpO2 97%      Physical Exam Constitutional:      General: She is not in acute distress.    Appearance: She is well-developed. She is ill-appearing.  HENT:     Head: Normocephalic and atraumatic.     Mouth/Throat:     Mouth: Mucous membranes are moist.     Pharynx: No posterior oropharyngeal erythema.  Eyes:     Conjunctiva/sclera: Conjunctivae normal.     Pupils: Pupils are equal, round, and reactive to light.  Cardiovascular:     Rate and Rhythm: Normal rate and regular rhythm.  Pulmonary:     Effort: Pulmonary effort is normal. No respiratory distress.     Breath sounds: Normal breath sounds. No wheezing  or rhonchi.  Musculoskeletal:        General: Normal range of motion.     Cervical back: Normal range of motion.  Skin:    General: Skin is warm and dry.  Neurological:     Mental Status: She is alert.      UC Treatments / Results  Labs (all labs ordered are listed, but only abnormal results are displayed) Labs Reviewed  POCT INFLUENZA A/B - Abnormal; Notable for the following components:      Result Value   Influenza B, POC Positive (*)    All other components within normal limits    EKG   Radiology No results found.  Procedures Procedures (including critical [name] time)  Medications Ordered in UC Medications  acetaminophen  (TYLENOL ) tablet 650 mg (650 mg Oral Given 12/08/24 1142)    Initial Impression / Assessment and Plan / UC Course  I have reviewed the triage vital signs and the nursing notes.  Pertinent labs & imaging results that were available during my [name] of the patient were reviewed by me and considered in my medical decision making (see chart for details).     Discussed home [name] for influenza Final Clinical Impressions(s) / UC Diagnoses   Final diagnoses:  Influenza     Discharge Instructions      Make sure you are drinking lots of fluids Take the Tamiflu  2 times a day for 5 days I have prescribed Tussionex for your cough See your doctor if not improving by next week    ED Prescriptions     Medication Sig Dispense Auth. Provider   oseltamivir  (TAMIFLU ) 75 MG capsule Take 1 capsule (75 mg total) by mouth every 12 (twelve) hours. 10 capsule Maranda Jamee Jacob, MD   chlorpheniramine-HYDROcodone  (TUSSIONEX) 10-8 MG/5ML Take 5 mLs by mouth every 12 (twelve) hours as needed for cough. 115 mL Maranda Jamee Jacob, MD      I have reviewed the PDMP during this encounter.    [1]  Social History Tobacco Use   Smoking status: Never   Smokeless tobacco: Never  Vaping Use   Vaping status: Never Used  Substance Use Topics   Alcohol use: Yes     Alcohol/week: 1.0 standard drink of alcohol    Types: 1 Glasses of wine per week    Comment: Very little alcohol use   Drug use: No     Maranda Jamee Jacob, MD 12/08/24 1227

## 2024-12-08 NOTE — Discharge Instructions (Signed)
 Make sure you are drinking lots of fluids Take the Tamiflu  2 times a day for 5 days I have prescribed Tussionex for your cough See your doctor if not improving by next week

## 2024-12-08 NOTE — ED Triage Notes (Addendum)
 Pt c/o cough since yesterday. Some chest discomfort from coughing. Hx of bronchitis. No OTC meds taken. COVID neg at home this am.

## 2025-01-18 ENCOUNTER — Ambulatory Visit (INDEPENDENT_AMBULATORY_CARE_PROVIDER_SITE_OTHER)

## 2025-01-18 VITALS — BP 152/71 | HR 69 | Ht 69.0 in | Wt 196.0 lb

## 2025-01-18 DIAGNOSIS — Z0000 Encounter for general adult medical examination without abnormal findings: Secondary | ICD-10-CM | POA: Diagnosis not present

## 2025-01-18 NOTE — Patient Instructions (Signed)
 [name],  Thank you for taking the time for your Medicare Wellness Visit. I appreciate your continued commitment to your health goals. Please review the care plan we discussed, and feel free to reach out if I can assist you further.  Please note that Annual Wellness Visits do not include a physical exam. Some assessments may be limited, especially if the visit was conducted virtually. If needed, we may recommend an in-person follow-up with your provider.  Ongoing Care Seeing your primary care provider every 3 to 6 months helps us  monitor your health and provide consistent, personalized care.   Referrals If a referral was made during today's visit and you haven't received any updates within two weeks, please contact the referred provider directly to check on the status.  Recommended Screenings:  Health Maintenance  Topic Date Due   COVID-19 Vaccine (1) Never done   Hepatitis C Screening  Never done   Zoster (Shingles) Vaccine (1 of 2) Never done   Flu Shot  03/21/2025*   Medicare Annual Wellness Visit  10/19/2025   Colon Cancer Screening  01/02/2028   DTaP/Tdap/Td vaccine (5 - Td or Tdap) 06/20/2031   Pneumococcal Vaccine for age over 39  Completed   Osteoporosis screening with Bone Density Scan  Completed   Meningitis B Vaccine  Aged Out   Breast Cancer Screening  Discontinued  *Topic was postponed. The date shown is not the original due date.       01/18/2025    9:57 AM  Advanced Directives  Does Patient Have a Medical Advance Directive? Yes  Type of Estate Agent of [place];Living will  Does patient want to make changes to medical advance directive? No - Patient declined    Vision: Annual vision screenings are recommended for early detection of glaucoma, cataracts, and diabetic retinopathy. These exams can also reveal signs of chronic conditions such as diabetes and high blood pressure.  Dental: Annual dental screenings help detect early signs of  oral cancer, gum disease, and other conditions linked to overall health, including heart disease and diabetes.  Please see the attached documents for additional preventive care recommendations.

## 2025-01-18 NOTE — Progress Notes (Signed)
 "  Chief Complaint  Patient presents with   Medicare Wellness     Subjective:   Amy [name] is a 79 y.o. female who presents for a Medicare Annual Wellness Visit.  Visit info / Clinical Intake: Medicare Wellness Visit Type:: Subsequent Annual Wellness Visit Persons participating in visit and providing information:: patient Medicare Wellness Visit Mode:: In-person (required for WTM) Interpreter Needed?: No Pre-visit prep was completed: yes AWV questionnaire completed by patient prior to visit?: yes Date:: 01/14/25 Living arrangements:: lives with spouse/significant other Patient's Overall Health Status Rating: good Typical amount of pain: some Does pain affect daily life?: no Are you currently prescribed opioids?: no  Dietary Habits and Nutritional Risks How many meals a day?: 3 Eats fruit and vegetables daily?: yes Most meals are obtained by: preparing own meals In the last 2 weeks, have you had any of the following?: none Diabetic:: no  Functional Status Activities of Daily Living (to include ambulation/medication): Independent Ambulation: Independent Medication Administration: Independent Home Management (perform basic housework or laundry): Independent Manage your own finances?: yes Primary transportation is: driving Concerns about vision?: no *vision screening is required for WTM* Concerns about hearing?: no  Fall Screening Falls in the past year?: 0 Number of falls in past year: 0 Was there an injury with Fall?: 0 Fall Risk Category Calculator: 0 Patient Fall Risk Level: Low Fall Risk  Fall Risk Patient at Risk for Falls Due to: No Fall Risks Fall risk Follow up: Falls evaluation completed  Home and Transportation Safety: All rugs have non-skid backing?: N/A, no rugs All stairs or steps have railings?: N/A, no stairs Grab bars in the bathtub or shower?: yes Have non-skid surface in bathtub or shower?: yes Good home lighting?: yes Regular seat belt  use?: yes Hospital stays in the last year:: no  Cognitive Assessment Difficulty concentrating, remembering, or making decisions? : no Will 6CIT or Mini Cog be Completed: yes What year is it?: 0 points What month is it?: 0 points Give patient an address phrase to remember (5 components): [address] [place], [place] About what time is it?: 0 points Count backwards from 20 to 1: 0 points Say the months of the year in reverse: 0 points Repeat the address phrase from earlier: 0 points 6 CIT Score: 0 points  Advance Directives (For Healthcare) Does Patient Have a Medical Advance Directive?: Yes Does patient want to make changes to medical advance directive?: No - Patient declined Type of Advance Directive: Healthcare Power of [place]; Living will  Reviewed/Updated  Reviewed/Updated: Medical History; Surgical History; Medications; Allergies; Care Teams; Patient Goals    Allergies (verified) Meperidine, Gabapentin , Rosuvastatin, Tape, Tapentadol, and Methylprednisolone   Current Medications (verified) Outpatient Encounter Medications as of 01/18/2025  Medication Sig   amLODipine  (NORVASC ) 5 MG tablet Take 1 tablet (5 mg total) by mouth daily.   colchicine  0.6 MG tablet Take 2 tablets (1.2mg ) now then take 1 tablet (0.6mg ) one hour later. Can take 1 tablet (0.6mg ) twice daily as needed until flare has resolved.   dorzolamide-timolol  (COSOPT) 22.3-6.8 MG/ML ophthalmic solution Place 1 drop into both eyes 2 (two) times daily.   hydrochlorothiazide  (HYDRODIURIL ) 25 MG tablet Take 1 tablet (25 mg total) by mouth daily.   latanoprost  (XALATAN ) 0.005 % ophthalmic solution Place 1 drop into both eyes at bedtime.   olmesartan  (BENICAR ) 40 MG tablet Take 1 tablet (40 mg total) by mouth daily.   triamcinolone  cream (KENALOG ) 0.1 % Apply topically 2 (two) times daily.   [  DISCONTINUED] chlorpheniramine-HYDROcodone  (TUSSIONEX) 10-8 MG/5ML Take 5 mLs by mouth every 12 (twelve) hours as needed for  cough.   [DISCONTINUED] oseltamivir  (TAMIFLU ) 75 MG capsule Take 1 capsule (75 mg total) by mouth every 12 (twelve) hours.   No facility-administered encounter medications on file as of 01/18/2025.    History: Past Medical History:  Diagnosis Date   Allergy 1977   Face swelling   Arthritis Not sure   Breast cancer (HCC)    Bronchitis    Glaucoma Several years   Severe   History of melanoma in situ 07/05/2024   History of right breast cancer 12/22/2016   History of tympanoplasty 03/09/2020   Hypercholesterolemia 10/10/2019   Hypertension App. 20 years   Neuropathy 10/11/2020   Skin cancer    Stage 3 chronic kidney disease (HCC) 10/10/2019   Past Surgical History:  Procedure Laterality Date   ABDOMINAL HYSTERECTOMY  1994   APPENDECTOMY  August 1977   BREAST SURGERY  April 2018   CESAREAN SECTION  [phone]   CESAREAN SECTION  03/1979   COSMETIC SURGERY  2017   EYE SURGERY  09-2016/01-2017   Glaucoma/Cataract surgery   FACIAL COSMETIC SURGERY     eye lid surgery   GLAUCOMA SURGERY     JOINT REPLACEMENT  05-2020   Full knee replacement   KNEE ARTHROSCOPY Bilateral    torn meniscus on bilateral knees   MASTECTOMY Right    MASTECTOMY COMPLETE / SIMPLE W/ SENTINEL NODE BIOPSY Right 05/08/2017   MASTECTOMY W/ SENTINEL NODE BIOPSY Right 05/08/2017   Procedure: RIGHT MASTECTOMY WITH SENTINEL LYMPH NODE BIOPSY;  Surgeon: Curvin Deward MOULD, MD;  Location: [facility] OR;  Service: General;  Laterality: Right;   NOSE SURGERY     deviated septum    titanium ear implant Left    for hearing left, Dr. Thaddeus   TUBAL LIGATION  [phone]   Family History  Problem Relation Age of Onset   Hypertension Mother    Skin cancer Father    Prostate cancer Father    Cancer Father 33   Breast cancer Sister    Cancer Sister 37   Social History   Occupational History   Occupation: Retired  Tobacco Use   Smoking status: Never   Smokeless tobacco: Never  Vaping Use   Vaping status: Never Used   Substance and Sexual Activity   Alcohol use: Yes    Alcohol/week: 1.0 standard drink of alcohol    Types: 1 Glasses of wine per week    Comment: Very little alcohol use   Drug use: No   Sexual activity: Not Currently    Partners: Male    Birth control/protection: None   Tobacco Counseling Counseling given: Not Answered  SDOH Screenings   Food Insecurity: No Food Insecurity (01/18/2025)  Housing: Low Risk (01/18/2025)  Transportation Needs: No Transportation Needs (01/18/2025)  Utilities: Not At Risk (01/18/2025)  Alcohol Screen: Low Risk (11/11/2024)  Depression (PHQ2-9): Low Risk (01/18/2025)  Financial Resource Strain: Low Risk (11/11/2024)  Physical Activity: Insufficiently Active (01/18/2025)  Social Connections: Socially Integrated (01/18/2025)  Stress: No Stress Concern Present (01/18/2025)  Tobacco Use: Low Risk (01/18/2025)  Health Literacy: Adequate Health Literacy (01/18/2025)   See flowsheets for full screening details  Depression Screen PHQ 2 & 9 Depression Scale- Over the past 2 weeks, how often have you been bothered by any of the following problems? Little interest or pleasure in doing things: 0 Feeling down, depressed, or hopeless (PHQ Adolescent also includes...irritable): 0 PHQ-2  Total Score: 0     Goals Addressed             This Visit's Progress    Patient Stated       Patient states she would like to continue to stay healthy.              Objective:    Today's Vitals   01/18/25 0943  BP: (!) 156/59  Pulse: 69  SpO2: 100%  Weight: 196 lb (88.9 kg)  Height: 5' 9 (1.753 m)   Body mass index is 28.94 kg/m.  Hearing/Vision screen No results found. Immunizations and Health Maintenance Health Maintenance  Topic Date Due   COVID-19 Vaccine (1) Never done   Hepatitis C Screening  Never done   Zoster Vaccines- Shingrix (1 of 2) Never done   Influenza Vaccine  03/21/2025 (Originally 07/22/2024)   Medicare Annual Wellness (AWV)  01/18/2026    Colonoscopy  01/02/2028   DTaP/Tdap/Td (5 - Td or Tdap) 06/20/2031   Pneumococcal Vaccine: 50+ Years  Completed   Bone Density Scan  Completed   Meningococcal B Vaccine  Aged Out   Mammogram  Discontinued        Assessment/Plan:  This is a routine wellness examination for Finley.  Patient Care Team: Willo Mini, NP as PCP - General (Nurse Practitioner) Octavia Bruckner, MD as Consulting Physician (Ophthalmology)  I have personally reviewed and noted the following in the patients chart:   Medical and social history Use of alcohol, tobacco or illicit drugs  Current medications and supplements including opioid prescriptions. Functional ability and status Nutritional status Physical activity Advanced directives List of other physicians Hospitalizations, surgeries, and ER visits in previous 12 months Vitals Screenings to include cognitive, depression, and falls Referrals and appointments  No orders of the defined types were placed in this encounter.  In addition, I have reviewed and discussed with patient certain preventive protocols, quality metrics, and best practice recommendations. A written personalized care plan for preventive services as well as general preventive health recommendations were provided to patient.   Bonny Jon Mayor, CMA   01/18/2025   Return in 1 year (on 01/18/2026).  After Visit Summary: (In Person-Declined) Patient declined AVS at this time.  Nurse Notes:   KHELANI KOPS is a 79 y.o. female patient of Mini Willo, NP who had a Medicare Annual Wellness Visit today. Kryslyn lives with her spouse. She reports that she is socially active and does interact with friends/family regularly. She is moderately physically active and enjoys antique shopping.   "

## 2025-03-06 ENCOUNTER — Ambulatory Visit: Admitting: Medical-Surgical

## 2026-01-23 ENCOUNTER — Ambulatory Visit
